# Patient Record
Sex: Male | Born: 1960 | Race: White | Hispanic: No | Marital: Married | Smoking: Never smoker
Health system: Southern US, Community
[De-identification: ages and names within clinical notes are randomized; demographics above are authoritative.]

## PROBLEM LIST (undated history)

## (undated) DIAGNOSIS — I10 Essential (primary) hypertension: Secondary | ICD-10-CM

## (undated) DIAGNOSIS — I2510 Atherosclerotic heart disease of native coronary artery without angina pectoris: Secondary | ICD-10-CM

## (undated) DIAGNOSIS — F419 Anxiety disorder, unspecified: Secondary | ICD-10-CM

## (undated) DIAGNOSIS — E7800 Pure hypercholesterolemia, unspecified: Secondary | ICD-10-CM

## (undated) HISTORY — PX: CATARACT EXTRACTION: SUR2

## (undated) HISTORY — DX: Pure hypercholesterolemia, unspecified: E78.00

## (undated) HISTORY — DX: Atherosclerotic heart disease of native coronary artery without angina pectoris: I25.10

## (undated) HISTORY — DX: Essential (primary) hypertension: I10

---

## 2009-05-21 ENCOUNTER — Encounter: Admission: RE | Admit: 2009-05-21 | Discharge: 2009-05-21 | Payer: Self-pay | Admitting: Internal Medicine

## 2011-09-22 ENCOUNTER — Other Ambulatory Visit: Payer: Self-pay

## 2011-09-22 ENCOUNTER — Emergency Department (HOSPITAL_COMMUNITY): Payer: 59

## 2011-09-22 ENCOUNTER — Emergency Department (HOSPITAL_COMMUNITY)
Admission: EM | Admit: 2011-09-22 | Discharge: 2011-09-22 | Disposition: A | Discharge status: Home / Self Care | Payer: 59 | Attending: Emergency Medicine | Admitting: Emergency Medicine

## 2011-09-22 DIAGNOSIS — R55 Syncope and collapse: Secondary | ICD-10-CM

## 2011-09-22 DIAGNOSIS — R404 Transient alteration of awareness: Secondary | ICD-10-CM | POA: Insufficient documentation

## 2011-09-22 DIAGNOSIS — F411 Generalized anxiety disorder: Secondary | ICD-10-CM | POA: Insufficient documentation

## 2011-09-22 DIAGNOSIS — R232 Flushing: Secondary | ICD-10-CM | POA: Insufficient documentation

## 2011-09-22 DIAGNOSIS — R450 Nervousness: Secondary | ICD-10-CM | POA: Insufficient documentation

## 2011-09-22 DIAGNOSIS — R61 Generalized hyperhidrosis: Secondary | ICD-10-CM | POA: Insufficient documentation

## 2011-09-22 DIAGNOSIS — Z79899 Other long term (current) drug therapy: Secondary | ICD-10-CM | POA: Insufficient documentation

## 2011-09-22 DIAGNOSIS — R110 Nausea: Secondary | ICD-10-CM | POA: Insufficient documentation

## 2011-09-22 DIAGNOSIS — I10 Essential (primary) hypertension: Secondary | ICD-10-CM | POA: Insufficient documentation

## 2011-09-22 HISTORY — DX: Anxiety disorder, unspecified: F41.9

## 2011-09-22 HISTORY — DX: Essential (primary) hypertension: I10

## 2011-09-22 LAB — CBC
HCT: 39.2 % (ref 39.0–52.0)
Hemoglobin: 14.1 g/dL (ref 13.0–17.0)
MCH: 32.1 pg (ref 26.0–34.0)
MCHC: 36 g/dL (ref 30.0–36.0)
MCV: 89.3 fL (ref 78.0–100.0)
Platelets: 209 10*3/uL (ref 150–400)
RBC: 4.39 MIL/uL (ref 4.22–5.81)
RDW: 11.6 % (ref 11.5–15.5)
WBC: 4.2 10*3/uL (ref 4.0–10.5)

## 2011-09-22 LAB — COMPREHENSIVE METABOLIC PANEL
ALT: 30 U/L (ref 0–53)
AST: 32 U/L (ref 0–37)
Albumin: 3.6 g/dL (ref 3.5–5.2)
Alkaline Phosphatase: 49 U/L (ref 39–117)
BUN: 17 mg/dL (ref 6–23)
CO2: 24 mEq/L (ref 19–32)
Calcium: 8.9 mg/dL (ref 8.4–10.5)
Chloride: 100 mEq/L (ref 96–112)
Creatinine, Ser: 0.87 mg/dL (ref 0.50–1.35)
GFR calc Af Amer: >90 mL/min (ref >90)
GFR calc non Af Amer: >90 mL/min (ref >90)
Glucose, Bld: 117 mg/dL — ABNORMAL HIGH (ref 70–99)
Potassium: 3.7 mEq/L (ref 3.5–5.1)
Sodium: 138 mEq/L (ref 135–145)
Total Bilirubin: 0.3 mg/dL (ref 0.3–1.2)
Total Protein: 6.8 g/dL (ref 6.0–8.3)

## 2011-09-22 LAB — MAGNESIUM: Magnesium: 1.9 mg/dL (ref 1.5–2.5)

## 2011-09-22 LAB — CARDIAC PANEL(CRET KIN+CKTOT+MB+TROPI)
CK, MB: 2.3 ng/mL (ref 0.3–4.0)
CK, MB: 2.3 ng/mL (ref 0.3–4.0)
Relative Index: 1.1 (ref 0.0–2.5)
Relative Index: 1.2 (ref 0.0–2.5)
Total CK: 212 U/L (ref 7–232)
Total CK: 196 U/L (ref 7–232)
Troponin I: <0.3 ng/mL (ref <0.30)
Troponin I: <0.3 ng/mL (ref <0.30)

## 2011-09-22 LAB — GLUCOSE, CAPILLARY
Glucose-Capillary: 107 mg/dL — ABNORMAL HIGH (ref 70–99)
Glucose-Capillary: 98 mg/dL (ref 70–99)

## 2011-09-22 LAB — POCT I-STAT TROPONIN I: Troponin i, poc: 0 ng/mL (ref 0.00–0.08)

## 2011-09-22 MED ORDER — LORAZEPAM 1 MG PO TABS: 0.5000 mg | ORAL_TABLET | Freq: Three times a day (TID) | ORAL | Status: AC | PRN

## 2011-09-22 MED ORDER — LORAZEPAM 2 MG/ML IJ SOLN
1.0000 mg | Freq: Once | INTRAMUSCULAR | Status: AC
  Administered 2011-09-22: 1 mg via INTRAVENOUS
  Filled 2011-09-22: qty 1

## 2011-09-22 NOTE — ED Notes (Signed)
When wasting administered Ativan for pt, it was inadvertently put into pyxis that the second admin was completely wasted, when in fact it had only 1mg  of waste.  It was then decided to leave the first admin of 1mg  unwasted as it would illustrate the total dosage of 2mg  between the two separate administrations.  This was done by primary nurse Ricki Miller and secondary nurse Madgie Dhaliwal.

## 2011-09-22 NOTE — ED Notes (Signed)
Pt had a syncope episode x 1 min while in our cancer center, was there with his dad who was receiving chemo. Pt now alert, oriented, airway intact '

## 2011-09-22 NOTE — ED Notes (Signed)
Called to room by tech because "pt is not acting right" since his return from MRI.  Entered room to find pt Alert and oriented x3, able to follow commands, PERRLA, able to puff cheeks, show teeth, hold tounge midline, shrug shoulders and rotate neck to left and right against resistance all with no trouble.  Strength 2+ and equal bilaterally.  VSS, but tachypenic. Pt states he is very anxious.  CBG 98.  EDMD notified of episode.

## 2011-09-22 NOTE — ED Notes (Signed)
QIO:NG29<BM> Expected date:09/22/11<BR> Expected time: 9:28 AM<BR> Means of arrival:Other<BR> Comments:<BR> Syncopal pt

## 2011-09-22 NOTE — ED Notes (Signed)
Secondary Assessment: pt had a syncope episode at 0920 this AM at cancer center, felt warm intially then passed out. Last 1 min, hit right side of head. When wake up, pt was clammy, and diaphoretic. Pt is not alert, oriented, sts he feels warm. No pain. Denied previous hx of same.

## 2011-09-22 NOTE — ED Provider Notes (Signed)
History     CSN: [id number] Arrival date & time: 09/22/2011  9:43 AM   First MD Initiated Contact with Patient 09/22/11 1008      Chief Complaint  Patient presents with  . Loss of Consciousness    syncope    (Consider location/radiation/quality/duration/timing/severity/associated sxs/prior treatment) HPI This patient presents following a syncopal episode. He notes no prior episodes. She recalls that just prior to nearly losing consciousness, he had a warm, flushed sensation with mild nausea. There was no pain anywhere throughout the entire episode. Upon regaining full faculties, the patient denied any shortness of breath, confusion, disorientation, and noted only a persistent warm sensation. No recent illness, no recent medical changes, no sick contacts. Past Medical History  Diagnosis Date  . Hypertension   . Anxiety     No past surgical history on file.  No family history on file.  History  Substance Use Topics  . Smoking status: Never Smoker   . Smokeless tobacco: Not on file  . Alcohol Use: Yes      Review of Systems  Constitutional: Positive for diaphoresis.  HENT: Negative.  Negative for sore throat.   Eyes: Negative.  Negative for visual disturbance.  Respiratory: Negative.  Negative for shortness of breath.   Cardiovascular: Negative for chest pain.  Gastrointestinal: Positive for nausea.  Genitourinary: Negative for dysuria.  Musculoskeletal: Negative for myalgias.  Skin: Negative for rash.  Neurological: Negative for headaches.  Psychiatric/Behavioral: The patient is nervous/anxious.     Allergies  Review of patient's allergies indicates no known allergies.  Home Medications   Current Outpatient Rx  Name Route Sig Dispense Refill  . CHOLECALCIFEROL 400 UNITS PO TABS Oral Take 400 Units by mouth daily.      Marland Kitchen CLONAZEPAM 0.5 MG PO TABS Oral Take 0.5 mg by mouth daily as needed. Anxiety      . OLMESARTAN MEDOXOMIL-HCTZ 40-25 MG PO TABS Oral Take 1  tablet by mouth daily.      Marland Kitchen OVER THE COUNTER MEDICATION Oral Take 1 tablet by mouth daily. Chromium picolinate       BP 126/76  Pulse 62  Temp(Src) 98.3 F (36.8 C) (Oral)  Resp 18  SpO2 95%  Physical Exam  Constitutional: He is oriented to person, place, and time. He appears well-developed and well-nourished.  HENT:  Head: Normocephalic and atraumatic.  Eyes: Conjunctivae are normal. Pupils are equal, round, and reactive to light.  Neck: Neck supple.  Cardiovascular: Normal rate and regular rhythm.   Pulmonary/Chest: No respiratory distress.  Abdominal: Soft. There is no tenderness.  Musculoskeletal: He exhibits no edema.  Neurological: He is alert and oriented to person, place, and time.  Skin: Skin is warm and dry.  Psychiatric: He has a normal mood and affect.    ED Course  Procedures (including critical care time)  Labs Reviewed  GLUCOSE, CAPILLARY - Abnormal; Notable for the following:    Glucose-Capillary 107 (*)    All other components within normal limits  POCT CBG MONITORING  CARDIAC PANEL(CRET KIN+CKTOT+MB+TROPI)  COMPREHENSIVE METABOLIC PANEL  MAGNESIUM  CBC   No results found.   No diagnosis found.   Date: 09/22/2011  Rate: 61  Rhythm: normal sinus rhythm  QRS Axis: normal  Intervals: normal  ST/T Wave abnormalities: normal  Conduction Disutrbances:none  Narrative Interpretation:   Old EKG Reviewed: none available    MDM  This generally well-appearing 50 year old male presents following a syncopal episode. The patient's description of a prodromal episode with no  chest pain or confusion or headache throughout, as well as the absence of entire loss of consciousness is reassuring and suggestive of vagal mediated syncope. On exam the patient is in no distress though he appears anxious. The patient's initial labs were reassuring however the CT demonstrated possible thalamic lesions. A followup MR did not demonstrate acute pathology. Serial cardiac  enzymes were not positive. Given the absence of cardiac enzyme elevation, any dysrhythmia on ECG, any acute pathology on neurologic studies, the patient will be discharged to follow up with his primary care physician.        Gerhard Munch, MD 09/22/11 [phone]

## 2015-05-02 ENCOUNTER — Ambulatory Visit: Payer: Self-pay | Admitting: Podiatry

## 2015-05-09 ENCOUNTER — Encounter: Payer: Self-pay | Admitting: Podiatry

## 2015-05-09 ENCOUNTER — Ambulatory Visit (INDEPENDENT_AMBULATORY_CARE_PROVIDER_SITE_OTHER): Payer: 59 | Admitting: Podiatry

## 2015-05-09 VITALS — BP 143/96 | HR 73 | Resp 12 | Ht 70.0 in | Wt 198.0 lb

## 2015-05-09 DIAGNOSIS — B351 Tinea unguium: Secondary | ICD-10-CM

## 2015-05-09 NOTE — Progress Notes (Signed)
   Subjective:    Patient ID: Chad Reynolds, male    DOB: May 01, 1961, 54 y.o.   MRN: [id number]  HPI    Review of Systems  All other systems reviewed and are negative.      Objective:   Physical Exam        Assessment & Plan:

## 2015-05-10 NOTE — Progress Notes (Signed)
Subjective:     Patient ID: Chad Reynolds, male   DOB: 1960/12/04, 54 y.o.   MRN: [id number]  HPI patient presents stating I have had a crumbled appearance to my right second nail over the last month with darkness and I'm not sure what I did and also several other nails that have a small amount of yellow discoloration   Review of Systems  All other systems reviewed and are negative.      Objective:   Physical Exam  Constitutional: He is oriented to person, place, and time.  Cardiovascular: Intact distal pulses.   Musculoskeletal: Normal range of motion.  Neurological: He is oriented to person, place, and time.  Skin: Skin is warm.  Nursing note and vitals reviewed.  neurovascular status intact muscle strength adequate range of motion within normal limits patient noted to have discoloration and what appears to be a dark type appearance to the right second nail bed of recent duration and several of the nails and a small amount of yellow discoloration distal on the hallux bilateral. Patient is also noted to have good digital perfusion is well oriented 3     Assessment:     Probable trauma to the right second nail with other nails having fungal infection    Plan:     H&P and condition reviewed and I advised this should grow out or eventually fall off and if it were to continue to persist over the next few months or 2 darkened or become painful and remove the nail and send to pathology. The other nails will be treated with a topical anti-fungal which was dispensed and patient will be seen back

## 2015-06-27 ENCOUNTER — Ambulatory Visit: Payer: 59 | Admitting: Podiatry

## 2016-02-07 ENCOUNTER — Encounter: Payer: Self-pay | Admitting: Podiatry

## 2016-02-07 ENCOUNTER — Ambulatory Visit (INDEPENDENT_AMBULATORY_CARE_PROVIDER_SITE_OTHER): Payer: 59 | Admitting: Podiatry

## 2016-02-07 DIAGNOSIS — M722 Plantar fascial fibromatosis: Secondary | ICD-10-CM

## 2016-02-07 DIAGNOSIS — B351 Tinea unguium: Secondary | ICD-10-CM

## 2016-02-08 NOTE — Progress Notes (Signed)
Subjective:     Patient ID: Chad Reynolds, male   DOB: 1961-08-18, 56 y.o.   MRN: [id number]  HPI patient presents concerned about his right second nail and states he's had some thickness of his nailbeds   Review of Systems     Objective:   Physical Exam Neurovascular status intact no change in health history with thickness of the nailbed that's localized in nature    Assessment:     Probable trauma to the nailbed itself    Plan:     Reviewed condition and trauma and do not recommend oral or antifungal therapy with consideration long-term for nail removal but I educated him on today

## 2016-02-29 ENCOUNTER — Ambulatory Visit (INDEPENDENT_AMBULATORY_CARE_PROVIDER_SITE_OTHER): Payer: 59 | Admitting: Podiatry

## 2016-02-29 ENCOUNTER — Encounter: Payer: Self-pay | Admitting: Podiatry

## 2016-02-29 VITALS — BP 127/81 | HR 69 | Resp 16

## 2016-02-29 DIAGNOSIS — L600 Ingrowing nail: Secondary | ICD-10-CM | POA: Diagnosis not present

## 2016-02-29 NOTE — Patient Instructions (Addendum)

## 2016-03-02 NOTE — Progress Notes (Signed)
Subjective:     Patient ID: Chad Reynolds, male   DOB: 29-May-1961, 55 y.o.   MRN: [id number]  HPI patient presents stating this right second nail is damaged and I know on getting need to have it removed. I want to get it done now   Review of Systems     Objective:   Physical Exam Neurovascular status intact with chronic deformity of the right second nail that's damaged and incurvated with some thickness on the dorsal surface that can become painful    Assessment:     Damaged second nail right    Plan:     Reviewed condition and recommended removal. I explained risk of procedure and explained will be done permanently and patient wants surgery and today I infiltrated the right second toe 60 mg like Marcaine mixture remove the nail exposed matrix and applied phenol 3 applications 30 seconds followed by alcohol lavage and sterile dressing. Gave instructions on soaks and reappoint

## 2016-03-07 ENCOUNTER — Telehealth: Payer: Self-pay | Admitting: *Deleted

## 2016-03-07 NOTE — Telephone Encounter (Signed)
Called patient at [phone] (Home #) to check to see how they were doing from when their nail was removed on Friday, February 29, 2016. I spoke to patient's wife who stated, "He's doing okay and soaking toe with some relief". I told patient's wife if her husband has any questions or concerns to call our office back. She stated she understood.

## 2016-04-28 ENCOUNTER — Telehealth: Payer: Self-pay | Admitting: *Deleted

## 2016-04-28 NOTE — Telephone Encounter (Signed)
Pt's wife, [name] states pt had ingrown procedure by Dr. Paulla Dolly and a chemical was applied to stop the nail from growing back but it has. I informed [name] that on rare occasion the nail can grow back, if it is causing problems he would need to have the procedure again.  Kim asked if they would have to pay again since it was out of pocket and I told her that would need to be discussed with Dr. Paulla Dolly.

## 2017-04-09 DIAGNOSIS — B354 Tinea corporis: Secondary | ICD-10-CM | POA: Diagnosis not present

## 2017-07-28 ENCOUNTER — Other Ambulatory Visit: Payer: Self-pay

## 2017-07-28 ENCOUNTER — Encounter: Payer: Self-pay | Admitting: Physician Assistant

## 2017-07-28 ENCOUNTER — Ambulatory Visit (INDEPENDENT_AMBULATORY_CARE_PROVIDER_SITE_OTHER): Payer: 59

## 2017-07-28 ENCOUNTER — Ambulatory Visit (INDEPENDENT_AMBULATORY_CARE_PROVIDER_SITE_OTHER): Payer: 59 | Admitting: Physician Assistant

## 2017-07-28 VITALS — BP 122/85 | HR 65 | Resp 16 | Ht 70.0 in | Wt 206.8 lb

## 2017-07-28 DIAGNOSIS — S62639A Displaced fracture of distal phalanx of unspecified finger, initial encounter for closed fracture: Secondary | ICD-10-CM

## 2017-07-28 DIAGNOSIS — S6992XA Unspecified injury of left wrist, hand and finger(s), initial encounter: Secondary | ICD-10-CM | POA: Diagnosis not present

## 2017-07-28 DIAGNOSIS — S62631A Displaced fracture of distal phalanx of left index finger, initial encounter for closed fracture: Secondary | ICD-10-CM | POA: Diagnosis not present

## 2017-07-28 DIAGNOSIS — S67193A Crushing injury of left middle finger, initial encounter: Secondary | ICD-10-CM | POA: Diagnosis not present

## 2017-07-28 DIAGNOSIS — S62635A Displaced fracture of distal phalanx of left ring finger, initial encounter for closed fracture: Secondary | ICD-10-CM | POA: Diagnosis not present

## 2017-07-28 DIAGNOSIS — S67191A Crushing injury of left index finger, initial encounter: Secondary | ICD-10-CM | POA: Diagnosis not present

## 2017-07-28 MED ORDER — HYDROCODONE-ACETAMINOPHEN 5-325 MG PO TABS: 1.0000 | ORAL_TABLET | Freq: Four times a day (QID) | ORAL | 0 refills | Status: AC | PRN

## 2017-07-28 NOTE — Progress Notes (Signed)
PRIMARY CARE AT [place], [place] [address] [phone]  Date:  07/28/2017   Name:  Chad Reynolds   DOB:  1961/09/10   MRN:  [id number]  PCP:  Tommy Medal, MD    History of Present Illness:  Chad Reynolds is a 56 y.o. male patient who presents to PCP with  Chief Complaint  Patient presents with  . Hand Injury    per patient, got 3 fingers on his left hand caught in garage door, patient states that he heard an audible "crack"     Patient reports that he was pulling his garage door, when it got caught between 2 sections of the door.  He thought he heard a crack.  This occurred about 3 hours ago.  He has had pain of his pointer, middle, and ring finger.  He has noticed more pain of the ring finger along with bruising of the finger.  Non-dominant finger.  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   There are no active problems to display for this patient.   Past Medical History:  Diagnosis Date  . Anxiety   . Hypertension     No past surgical history on file.  Social History  Substance Use Topics  . Smoking status: Never Smoker  . Smokeless tobacco: Never Used  . Alcohol use Yes    No family history on file.  No Known Allergies  Medication list has been reviewed and updated.  Current Outpatient Prescriptions on File Prior to Visit  Medication Sig Dispense Refill  . cholecalciferol (VITAMIN D) 400 UNITS TABS Take 400 Units by mouth daily.      Marland Kitchen olmesartan-hydrochlorothiazide (BENICAR HCT) 40-25 MG per tablet Take 1 tablet by mouth daily.      Marland Kitchen OVER THE COUNTER MEDICATION Take 1 tablet by mouth daily. Chromium picolinate     . PRESCRIPTION MEDICATION     . Vilazodone HCl (VIIBRYD PO) Take by mouth.    . clonazePAM (KLONOPIN) 0.5 MG tablet Take 0.5 mg by mouth daily as needed. Anxiety       No current facility-administered medications on file prior to visit.     ROS ROS otherwise unremarkable unless listed above.  Physical Examination: BP 122/85 (BP Location:  Right Arm, Patient Position: Sitting, Cuff Size: Normal)   Pulse 65   Resp 16   Ht 5\' 10"  (1.778 m)   Wt 206 lb 12.8 oz (93.8 kg)   SpO2 95%   BMI 29.67 kg/m  Ideal Body Weight: Weight in (lb) to have BMI = 25: 173.9  Physical Exam  Constitutional: He is oriented to person, place, and time. He appears well-developed and well-nourished. No distress.  HENT:  Head: Normocephalic and atraumatic.  Eyes: Pupils are equal, round, and reactive to light. Conjunctivae and EOM are normal.  Cardiovascular: Normal rate.   Pulmonary/Chest: Effort normal. No respiratory distress.  Musculoskeletal:       Left hand: Normal sensation noted. Normal strength noted.  4th left finger hematoma about 40% nail.  Tender upon palpation.  Good ROM of dip through 2nd-4th fingers.  Neurological: He is alert and oriented to person, place, and time.  Skin: Skin is warm and dry. He is not diaphoretic.  Psychiatric: He has a normal mood and affect. His behavior is normal.    Dg Hand Complete Left  Result Date: 07/28/2017 CLINICAL DATA:  Injured the left hand with pain EXAM: LEFT HAND - COMPLETE 3+ VIEW COMPARISON:  None. FINDINGS: There is  transverse minimally displaced fracture of the tuft of the distal phalanx of the left fourth digit. No other acute abnormality is seen. Joint spaces appear normal. No erosion is seen. IMPRESSION: Minimally displaced fracture of the tuft of the distal phalanx of the left fourth digit. Electronically Signed   By: Ivar Drape M.D.   On: 07/28/2017 08:58     Assessment and Plan: Chad Reynolds is a 56 y.o. male who is here today for cc of  Chief Complaint  Patient presents with  . Hand Injury    per patient, got 3 fingers on his left hand caught in garage door, patient states that he heard an audible "crack"   Discussed the displaced finger fracture with option to see hand vs watching, placed in bracing and follow up in 3-4 weeks for recheck of symptoms. He would like to go ahead  and be braced and rtc for reimaging.  He was advised to come out of the brace for rom during the day.  He voiced understanding.   Warned of alarming sxs to warrant immediate return.  Closed fracture of tuft of distal phalanx of finger  Hand injury, left, initial encounter - Plan: DG Hand Complete Left, CANCELED: DG Hand Complete Left  Ivar Drape, PA-C [facility] 9/23/20184:23 PM

## 2017-07-28 NOTE — Patient Instructions (Addendum)
IF you received an x-ray today, you will receive an invoice from [facility]. Please contact [facility] at [phone] with questions or concerns regarding your invoice.   IF you received labwork today, you will receive an invoice from [facility]. Please contact [facility] at [phone] with questions or concerns regarding your invoice.   Our billing staff will not be able to assist you with questions regarding bills from these companies.  You will be contacted with the lab results as soon as they are available. The fastest way to get your results is to activate your My Chart account. Instructions are located on the last page of this paperwork. If you have not heard from [place] regarding the results in 2 weeks, please contact this office.    Please come out of the finger splint often and bend the fingers back and forth to keep mobility of the finger.    Finger Fracture A finger fracture is a break in any of the bones in your fingers. Usually, a broken finger is caused by an injury. This includes:  Getting hurt while playing sports.  Getting hurt at work.  Falling.  Your doctor may put a splint on your finger so it will not move while it heals (immobilization). Follow these instructions at home: If you have a splint  Wear the splint as told by your doctor. Take it off only as told by your doctor.  Loosen the splint if your fingers tingle, get numb, or turn cold and blue.  Keep the splint clean.  If the splint is not waterproof: ? Do not let it get wet. ? Cover it with a watertight covering when you take a bath or a shower. Managing pain, stiffness, and swelling  If directed, put ice on the injured area: ? If you have a removable splint, take it off as told by your doctor. ? Put ice in a plastic bag. ? Place a towel between your skin and the bag. ? Leave the ice on for 20 minutes, 2-3 times a day.  Move your fingers often to help with stiffness and  swelling.  Raise (elevate) the injured area above the level of your heart while you are sitting or lying down. Driving  Do not drive or use heavy machinery while taking prescription pain medicine.  Ask your doctor when it is safe to drive if you have a splint. General instructions  Do not put pressure on any part of the splint until it is fully hardened. This may take several hours.  Do not use any products that contain nicotine or tobacco, such as cigarettes and e-cigarettes. These can delay bone healing. If you need help quitting, ask your doctor.  Take over-the-counter and prescription medicines only as told by your doctor.  Do exercises as told by your doctor.  Keep all follow-up visits as told by your doctor. This is important. Contact a doctor if:  Your pain or swelling gets worse.  You have trouble moving your finger. Get help right away if:  Your finger gets numb or turns blue. Summary  A finger fracture is a break in any of the bones in your fingers.  Usually, a broken finger is caused by an injury. This may be from getting hurt in sports or at work or from falling.  You may need to wear a splint on your finger so it will not move while it heals (immobilization). This information is not intended to replace advice given to you by  your health care provider. Make sure you discuss any questions you have with your health care provider. Document Released: 04/14/2008 Document Revised: 09/16/2016 Document Reviewed: 09/16/2016 Elsevier Interactive Patient Education  2017 [employer].

## 2017-08-02 ENCOUNTER — Encounter: Payer: Self-pay | Admitting: Physician Assistant

## 2017-08-11 DIAGNOSIS — Z23 Encounter for immunization: Secondary | ICD-10-CM | POA: Diagnosis not present

## 2017-08-27 DIAGNOSIS — S67191D Crushing injury of left index finger, subsequent encounter: Secondary | ICD-10-CM | POA: Diagnosis not present

## 2017-08-27 DIAGNOSIS — S67193D Crushing injury of left middle finger, subsequent encounter: Secondary | ICD-10-CM | POA: Diagnosis not present

## 2017-08-27 DIAGNOSIS — S62635D Displaced fracture of distal phalanx of left ring finger, subsequent encounter for fracture with routine healing: Secondary | ICD-10-CM | POA: Diagnosis not present

## 2017-10-05 DIAGNOSIS — Z125 Encounter for screening for malignant neoplasm of prostate: Secondary | ICD-10-CM | POA: Diagnosis not present

## 2017-10-05 DIAGNOSIS — Z1159 Encounter for screening for other viral diseases: Secondary | ICD-10-CM | POA: Diagnosis not present

## 2017-10-05 DIAGNOSIS — Z0000 Encounter for general adult medical examination without abnormal findings: Secondary | ICD-10-CM | POA: Diagnosis not present

## 2017-10-08 DIAGNOSIS — Z0001 Encounter for general adult medical examination with abnormal findings: Secondary | ICD-10-CM | POA: Diagnosis not present

## 2017-10-08 DIAGNOSIS — L309 Dermatitis, unspecified: Secondary | ICD-10-CM | POA: Diagnosis not present

## 2017-10-08 DIAGNOSIS — E7800 Pure hypercholesterolemia, unspecified: Secondary | ICD-10-CM | POA: Diagnosis not present

## 2017-10-27 DIAGNOSIS — D1801 Hemangioma of skin and subcutaneous tissue: Secondary | ICD-10-CM | POA: Diagnosis not present

## 2017-10-27 DIAGNOSIS — L821 Other seborrheic keratosis: Secondary | ICD-10-CM | POA: Diagnosis not present

## 2017-10-27 DIAGNOSIS — L300 Nummular dermatitis: Secondary | ICD-10-CM | POA: Diagnosis not present

## 2017-12-14 IMAGING — DX DG HAND COMPLETE 3+V*L*
3 series · 3 of 3 positions shown · non-contrast
Comparison: None.

CLINICAL DATA: Injured the left hand with pain

EXAM:
LEFT HAND - COMPLETE 3+ VIEW

[hand pa]
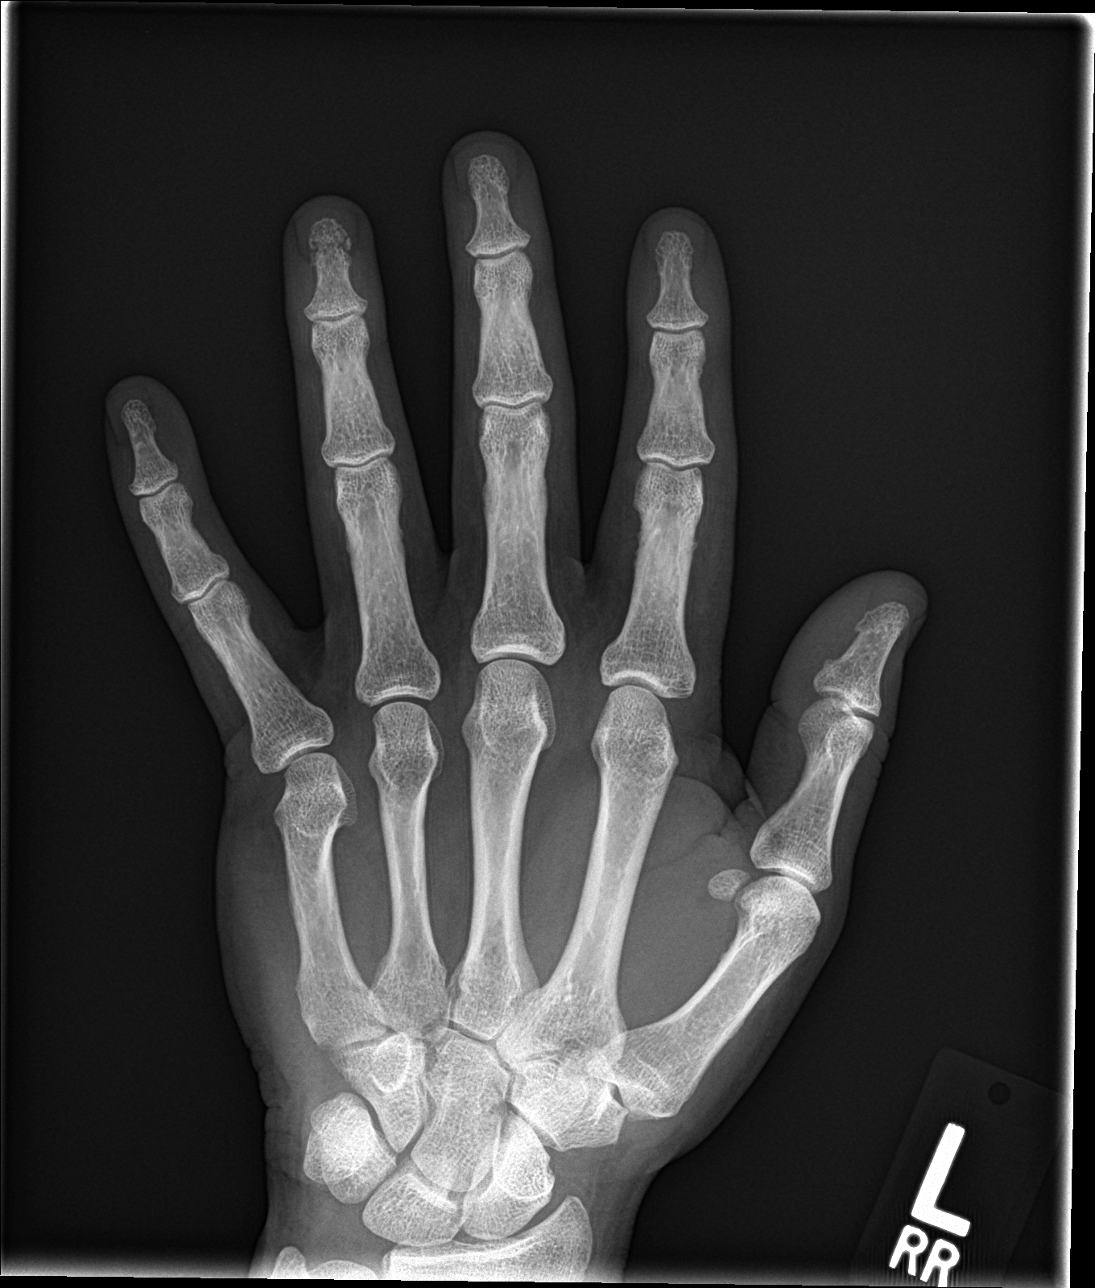

[hand obl]
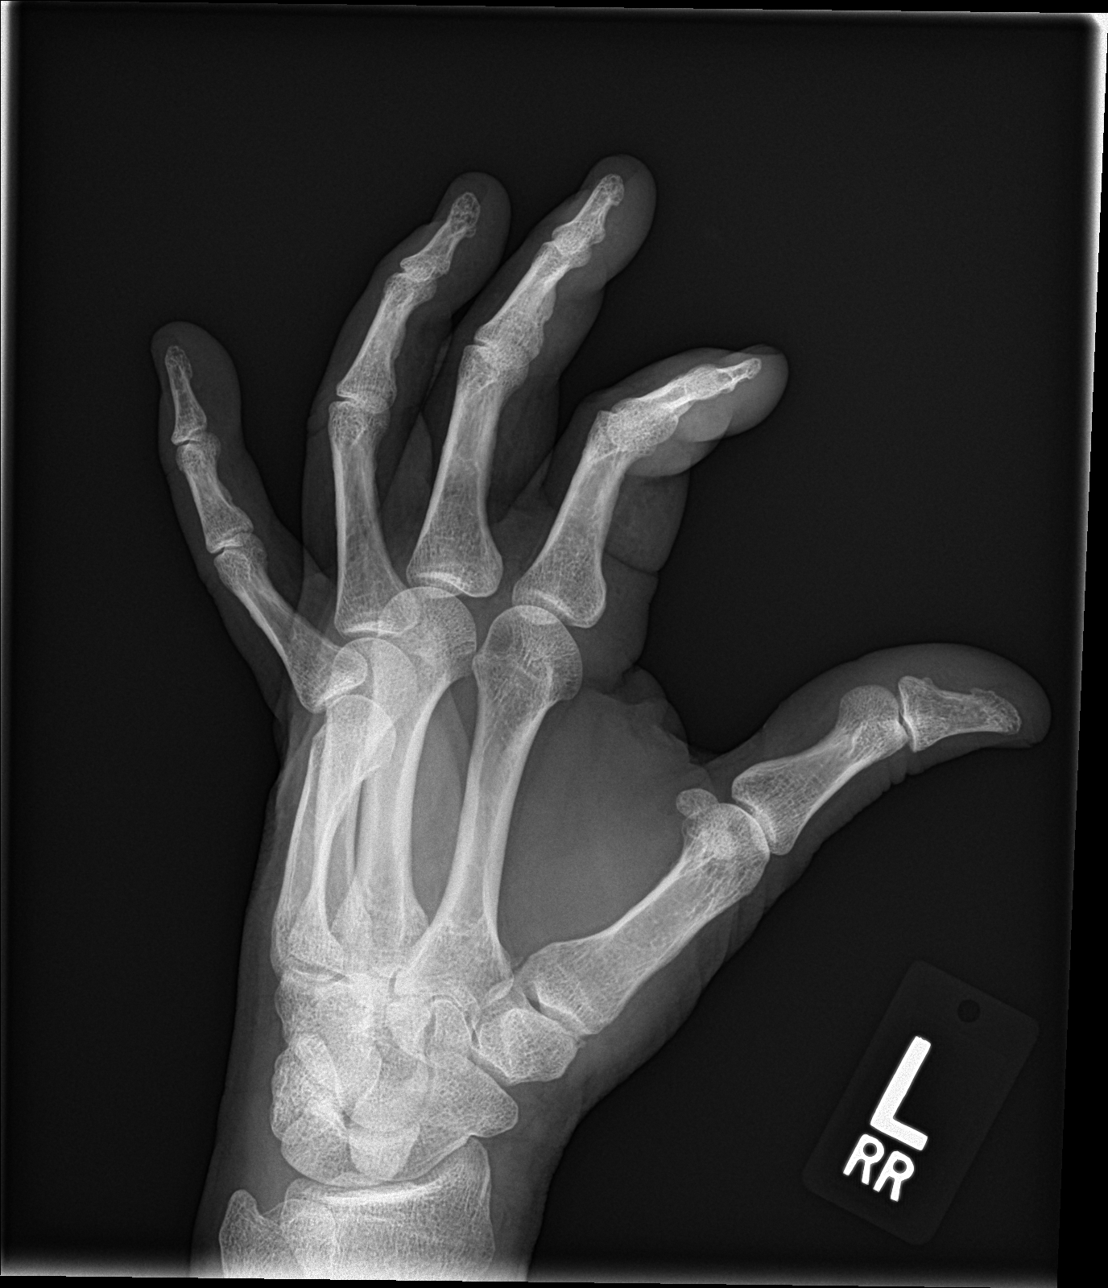

[hand lat]
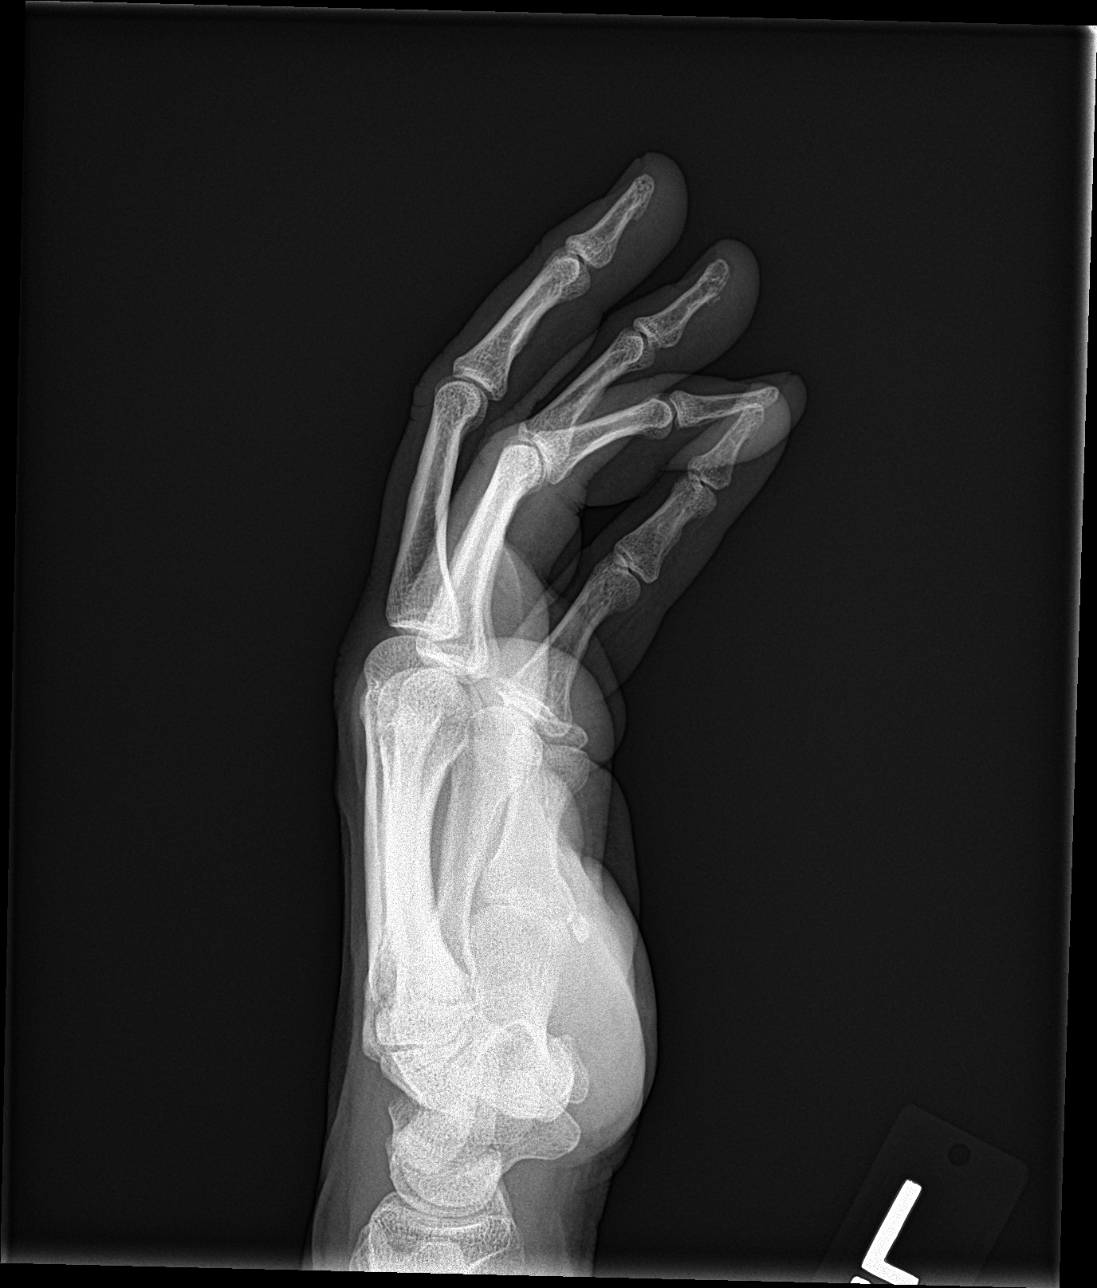

[3 of 3 positions shown; findings below may reference images not displayed]

FINDINGS: There is transverse minimally displaced fracture of the tuft of the
distal phalanx of the left fourth digit. No other acute abnormality
is seen. Joint spaces appear normal. No erosion is seen.
IMPRESSION: Minimally displaced fracture of the tuft of the distal phalanx of
the left fourth digit.

## 2017-12-17 ENCOUNTER — Ambulatory Visit: Payer: 59 | Admitting: Neurology

## 2017-12-17 ENCOUNTER — Encounter: Payer: Self-pay | Admitting: Neurology

## 2017-12-17 VITALS — BP 121/79 | HR 69 | Ht 70.0 in | Wt 215.0 lb

## 2017-12-17 DIAGNOSIS — G251 Drug-induced tremor: Secondary | ICD-10-CM

## 2017-12-17 NOTE — Progress Notes (Signed)
Reason for visit: Tremor  Referring physician: Dr. Awilda Bill Chad Reynolds is a 57 y.o. male  History of present illness:  Chad Reynolds is a 57 year old right-handed white male with a history of bipolar disorder.  The patient has been treated with Depakote, the medication was started about 2 years ago.  About 2 years ago, the patient began noting a tremor involving both upper extremities.  He has noted that he may have some difficulty with typing, there has been some minimal impact on his ability to perform handwriting, he may sometimes have difficulty with drinking liquids or feeding himself.  The patient denies any tremor affecting the voice or the head or neck.  The patient denies any balance issues, but no falls.  He has not had any numbness or weakness of the extremities or difficulty with headache or dizziness.  He denies issues controlling the bowels or the bladder.  He does have a prominent family history of tremor, his maternal grandmother, and his mother had an essential tremor.  His mother had a deep brain stimulator placement as the tremor was quite severe.  The patient is concerned that he may be developing a similar tremor.  The patient has 3 brothers, none of his siblings have tremor.  His mother developed a tremor in her mid 35s.  Past Medical History:  Diagnosis Date  . Anxiety   . Hypertension     Past Surgical History:  Procedure Laterality Date  . CATARACT EXTRACTION Bilateral     Family History  Problem Relation Age of Onset  . Tremor Mother   . Diabetes Mother   . Diabetes Father   . Atrial fibrillation Father   . Heart disease Father   . Lymphoma Father   . Tremor Maternal Grandmother     Social history:  reports that  has never smoked. he has never used smokeless tobacco. He reports that he drinks alcohol. He reports that he does not use drugs.  Medications:  Prior to Admission medications   Medication Sig Start Date End Date Taking? Authorizing Provider    Azilsartan Medoxomil (EDARBI) 40 MG TABS Take 40 mg by mouth daily.   Yes [provider]  divalproex (DEPAKOTE) 500 MG DR tablet Take 500 mg by mouth.   Yes [provider]  DULoxetine (CYMBALTA) 60 MG capsule Take 30 mg by mouth daily.    Yes [provider]  naltrexone (DEPADE) 50 MG tablet Take 50 mg by mouth daily.   Yes [provider]  OVER THE COUNTER MEDICATION Take 1 tablet by mouth daily. Chromium picolinate    Yes [provider]     No Known Allergies  ROS:  Out of a complete 14 system review of symptoms, the patient complains only of the following symptoms, and all other reviewed systems are negative.  Joint pain Skin sensitivity Weakness, tremor Anxiety, decreased energy Snoring  Blood pressure 121/79, pulse 69, height 5\' 10"  (1.778 m), weight 215 lb (97.5 kg).  Physical Exam  General: The patient is alert and cooperative at the time of the examination.  Eyes: Pupils are equal, round, and reactive to light. Discs are flat bilaterally.  Neck: The neck is supple, no carotid bruits are noted.  Respiratory: The respiratory examination is clear.  Cardiovascular: The cardiovascular examination reveals a regular rate and rhythm, no obvious murmurs or rubs are noted.  Skin: Extremities are without significant edema.  Neurologic Exam  Mental status: The patient is alert  and oriented x 3 at the time of the examination. The patient has apparent normal recent and remote memory, with an apparently normal attention span and concentration ability.  Cranial nerves: Facial symmetry is present. There is good sensation of the face to pinprick and soft touch bilaterally. The strength of the facial muscles and the muscles to head turning and shoulder shrug are normal bilaterally. Speech is well enunciated, no aphasia or dysarthria is noted. Extraocular movements are full. Visual fields are full. The tongue is midline, and the patient has  symmetric elevation of the soft palate. No obvious hearing deficits are noted.  Motor: The motor testing reveals 5 over 5 strength of all 4 extremities. Good symmetric motor tone is noted throughout.  Sensory: Sensory testing is intact to pinprick, soft touch, vibration sensation, and position sense on all 4 extremities. No evidence of extinction is noted.  Coordination: Cerebellar testing reveals good finger-nose-finger and heel-to-shin bilaterally.  The patient does have a fine action type tremor with both upper extremities.  Minimal tremor is translated into handwriting when drawing a spiral.  Gait and station: Gait is normal. Tandem gait is normal. Romberg is negative. No drift is seen.  Reflexes: Deep tendon reflexes are symmetric and normal bilaterally. Toes are downgoing bilaterally.   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  Family history of essential tremor  The patient is at risk for developing an essential tremor, it is possible he may develop this later on in life.  Currently, his tremor appears to be a fine action type tremor most consistent with a Depakote tremor.  Cessation of Depakote should alleviate the tremor.  The patient also drinks 4 or 5 caffeinated soft drinks a day, I have recommended that he reduce his caffeine intake.  The patient will follow-up through this office on an as-needed basis.  Jill Alexanders MD 12/17/2017 9:02 AM  [facility] [address] [place], [place] [address]  Phone [phone] Fax [phone]

## 2018-01-07 DIAGNOSIS — E291 Testicular hypofunction: Secondary | ICD-10-CM | POA: Diagnosis not present

## 2018-01-07 DIAGNOSIS — R291 Meningismus: Secondary | ICD-10-CM | POA: Diagnosis not present

## 2018-01-07 DIAGNOSIS — R251 Tremor, unspecified: Secondary | ICD-10-CM | POA: Diagnosis not present

## 2018-01-11 DIAGNOSIS — M7711 Lateral epicondylitis, right elbow: Secondary | ICD-10-CM | POA: Diagnosis not present

## 2018-01-12 DIAGNOSIS — E291 Testicular hypofunction: Secondary | ICD-10-CM | POA: Diagnosis not present

## 2018-01-26 DIAGNOSIS — E291 Testicular hypofunction: Secondary | ICD-10-CM | POA: Diagnosis not present

## 2018-01-28 DIAGNOSIS — R270 Ataxia, unspecified: Secondary | ICD-10-CM | POA: Diagnosis not present

## 2018-01-28 DIAGNOSIS — G252 Other specified forms of tremor: Secondary | ICD-10-CM | POA: Diagnosis not present

## 2018-01-28 DIAGNOSIS — Z820 Family history of epilepsy and other diseases of the nervous system: Secondary | ICD-10-CM | POA: Diagnosis not present

## 2018-02-06 DIAGNOSIS — R062 Wheezing: Secondary | ICD-10-CM | POA: Diagnosis not present

## 2018-02-09 DIAGNOSIS — E291 Testicular hypofunction: Secondary | ICD-10-CM | POA: Diagnosis not present

## 2018-02-09 DIAGNOSIS — R05 Cough: Secondary | ICD-10-CM | POA: Diagnosis not present

## 2018-02-12 DIAGNOSIS — G250 Essential tremor: Secondary | ICD-10-CM | POA: Insufficient documentation

## 2018-02-12 HISTORY — DX: Essential tremor: G25.0

## 2018-02-17 ENCOUNTER — Encounter: Payer: Self-pay | Admitting: Physician Assistant

## 2018-02-23 DIAGNOSIS — E291 Testicular hypofunction: Secondary | ICD-10-CM | POA: Diagnosis not present

## 2018-03-09 DIAGNOSIS — E291 Testicular hypofunction: Secondary | ICD-10-CM | POA: Diagnosis not present

## 2018-03-23 DIAGNOSIS — E291 Testicular hypofunction: Secondary | ICD-10-CM | POA: Diagnosis not present

## 2018-04-02 DIAGNOSIS — B354 Tinea corporis: Secondary | ICD-10-CM | POA: Diagnosis not present

## 2018-04-02 DIAGNOSIS — L280 Lichen simplex chronicus: Secondary | ICD-10-CM | POA: Diagnosis not present

## 2018-04-09 DIAGNOSIS — I10 Essential (primary) hypertension: Secondary | ICD-10-CM | POA: Diagnosis not present

## 2018-04-09 DIAGNOSIS — I82402 Acute embolism and thrombosis of unspecified deep veins of left lower extremity: Secondary | ICD-10-CM | POA: Diagnosis not present

## 2018-04-09 DIAGNOSIS — M7989 Other specified soft tissue disorders: Secondary | ICD-10-CM | POA: Diagnosis not present

## 2018-04-09 DIAGNOSIS — L03116 Cellulitis of left lower limb: Secondary | ICD-10-CM | POA: Diagnosis not present

## 2018-04-09 DIAGNOSIS — Z79899 Other long term (current) drug therapy: Secondary | ICD-10-CM | POA: Diagnosis not present

## 2018-04-10 DIAGNOSIS — I10 Essential (primary) hypertension: Secondary | ICD-10-CM | POA: Diagnosis not present

## 2018-04-10 DIAGNOSIS — Z79899 Other long term (current) drug therapy: Secondary | ICD-10-CM | POA: Diagnosis not present

## 2018-04-10 DIAGNOSIS — L03116 Cellulitis of left lower limb: Secondary | ICD-10-CM | POA: Diagnosis not present

## 2018-04-13 DIAGNOSIS — L0390 Cellulitis, unspecified: Secondary | ICD-10-CM | POA: Diagnosis not present

## 2018-04-14 DIAGNOSIS — Z79899 Other long term (current) drug therapy: Secondary | ICD-10-CM | POA: Diagnosis not present

## 2018-04-14 DIAGNOSIS — Z125 Encounter for screening for malignant neoplasm of prostate: Secondary | ICD-10-CM | POA: Diagnosis not present

## 2018-04-14 DIAGNOSIS — Z5181 Encounter for therapeutic drug level monitoring: Secondary | ICD-10-CM | POA: Diagnosis not present

## 2018-04-20 DIAGNOSIS — L0390 Cellulitis, unspecified: Secondary | ICD-10-CM | POA: Diagnosis not present

## 2018-04-20 DIAGNOSIS — E291 Testicular hypofunction: Secondary | ICD-10-CM | POA: Diagnosis not present

## 2018-04-20 DIAGNOSIS — I10 Essential (primary) hypertension: Secondary | ICD-10-CM | POA: Diagnosis not present

## 2018-05-19 DIAGNOSIS — H02052 Trichiasis without entropian right lower eyelid: Secondary | ICD-10-CM | POA: Diagnosis not present

## 2018-05-19 DIAGNOSIS — H01022 Squamous blepharitis right lower eyelid: Secondary | ICD-10-CM | POA: Diagnosis not present

## 2018-05-19 DIAGNOSIS — H01021 Squamous blepharitis right upper eyelid: Secondary | ICD-10-CM | POA: Diagnosis not present

## 2018-08-10 DIAGNOSIS — Z23 Encounter for immunization: Secondary | ICD-10-CM | POA: Diagnosis not present

## 2018-10-18 DIAGNOSIS — Z0000 Encounter for general adult medical examination without abnormal findings: Secondary | ICD-10-CM | POA: Diagnosis not present

## 2018-10-18 DIAGNOSIS — Z125 Encounter for screening for malignant neoplasm of prostate: Secondary | ICD-10-CM | POA: Diagnosis not present

## 2018-10-21 DIAGNOSIS — Z0001 Encounter for general adult medical examination with abnormal findings: Secondary | ICD-10-CM | POA: Diagnosis not present

## 2018-10-21 DIAGNOSIS — Z23 Encounter for immunization: Secondary | ICD-10-CM | POA: Diagnosis not present

## 2018-10-21 DIAGNOSIS — E7800 Pure hypercholesterolemia, unspecified: Secondary | ICD-10-CM | POA: Diagnosis not present

## 2018-10-21 DIAGNOSIS — I10 Essential (primary) hypertension: Secondary | ICD-10-CM | POA: Diagnosis not present

## 2018-10-21 DIAGNOSIS — Z1212 Encounter for screening for malignant neoplasm of rectum: Secondary | ICD-10-CM | POA: Diagnosis not present

## 2018-11-04 ENCOUNTER — Encounter: Payer: Self-pay | Admitting: Emergency Medicine

## 2018-11-04 DIAGNOSIS — F1020 Alcohol dependence, uncomplicated: Secondary | ICD-10-CM | POA: Insufficient documentation

## 2018-11-04 HISTORY — DX: Alcohol dependence, uncomplicated: F10.20

## 2018-11-24 ENCOUNTER — Encounter: Payer: Self-pay | Admitting: Psychiatry

## 2018-11-24 ENCOUNTER — Ambulatory Visit: Payer: 59 | Admitting: Psychiatry

## 2018-11-24 DIAGNOSIS — F1021 Alcohol dependence, in remission: Secondary | ICD-10-CM | POA: Diagnosis not present

## 2018-11-24 DIAGNOSIS — F331 Major depressive disorder, recurrent, moderate: Secondary | ICD-10-CM | POA: Diagnosis not present

## 2018-11-24 MED ORDER — ACAMPROSATE CALCIUM 333 MG PO TBEC: 666.0000 mg | DELAYED_RELEASE_TABLET | Freq: Three times a day (TID) | ORAL | 5 refills | Status: DC

## 2018-11-24 MED ORDER — NALTREXONE HCL 50 MG PO TABS: 50.0000 mg | ORAL_TABLET | Freq: Every day | ORAL | 5 refills | Status: DC

## 2018-11-24 MED ORDER — DIVALPROEX SODIUM 500 MG PO DR TAB: 1500.0000 mg | DELAYED_RELEASE_TABLET | Freq: Every day | ORAL | 5 refills | Status: DC

## 2018-11-24 MED ORDER — DULOXETINE HCL 30 MG PO CSDR: 90.0000 mg | DELAYED_RELEASE_CAPSULE | Freq: Every day | ORAL | 5 refills | Status: DC

## 2018-11-24 NOTE — Progress Notes (Signed)
[name] [id number] [birth year] 58 y.o.  Subjective:   Patient ID:  Chad Reynolds is a 58 y.o. (DOB [birth year]) male.  Chief Complaint:  Chief Complaint  Patient presents with  . Follow-up    Medication Management    HPI AVISHAI Reynolds presents to the office today for follow-up of bipolar disorder and alcohold dependence vs bipolar mixed.  Good except health issues with father.  Stressful.  Meds help with the anxiety related.  Busy season.  Handles ok usually.    Mood stable and pleased with meds.  Patient reports stable mood and denies depressed or irritable moods.  Patient denies any recent difficulty with anxiety.  Patient denies difficulty with sleep initiation or maintenance. Denies appetite disturbance.  Patient reports that energy and motivation have been good.  Patient denies any difficulty with concentration.  Patient denies any suicidal ideation.  He denies alcohol abuse currently.  Past Psychiatric Medications: Lexapro and Viibyrd Review of Systems:  Review of Systems  Neurological: Negative for tremors and weakness.  Psychiatric/Behavioral: Negative for agitation, behavioral problems, confusion, decreased concentration, dysphoric mood, hallucinations, self-injury, sleep disturbance and suicidal ideas. The patient is not nervous/anxious and is not hyperactive.     Medications: I have reviewed the patient's current medications.  Current Outpatient Medications  Medication Sig Dispense Refill  . acamprosate (CAMPRAL) 333 MG tablet Take 2 tablets (666 mg total) by mouth 3 (three) times daily. 180 tablet 5  . Azilsartan Medoxomil (EDARBI) 40 MG TABS Take 40 mg by mouth daily.    . divalproex (DEPAKOTE) 500 MG DR tablet Take 3 tablets (1,500 mg total) by mouth at bedtime. 90 tablet 5  . DULoxetine HCl 30 MG CSDR Take 90 mg by mouth daily. 90 capsule 5  . naltrexone (DEPADE) 50 MG tablet Take 1 tablet (50 mg total) by mouth daily. 30 tablet 5   No current  facility-administered medications for this visit.     Medication Side Effects: None  Allergies: No Known Allergies  Past Medical History:  Diagnosis Date  . Anxiety   . Hypertension     Family History  Problem Relation Age of Onset  . Tremor Mother   . Diabetes Mother   . Diabetes Father   . Atrial fibrillation Father   . Heart disease Father   . Lymphoma Father   . Tremor Maternal Grandmother     Social History   Socioeconomic History  . Marital status: Unknown    Spouse name: Not on file  . Number of children: 2  . Years of education: BA  . Highest education level: Not on file  Occupational History  . Occupation: Surveyor, quantity  . Financial resource strain: Not on file  . Food insecurity:    Worry: Not on file    Inability: Not on file  . Transportation needs:    Medical: Not on file    Non-medical: Not on file  Tobacco Use  . Smoking status: Never Smoker  . Smokeless tobacco: Never Used  Substance and Sexual Activity  . Alcohol use: Yes  . Drug use: No  . Sexual activity: Not on file  Lifestyle  . Physical activity:    Days per week: Not on file    Minutes per session: Not on file  . Stress: Not on file  Relationships  . Social connections:    Talks on phone: Not on file    Gets together: Not on file    Attends religious  service: Not on file    Active member of club or organization: Not on file    Attends meetings of clubs or organizations: Not on file    Relationship status: Not on file  . Intimate partner violence:    Fear of current or ex partner: Not on file    Emotionally abused: Not on file    Physically abused: Not on file    Forced sexual activity: Not on file  Other Topics Concern  . Not on file  Social History Narrative   Lives with wife   Caffeine use: daily   Right handed     Past Medical History, Surgical history, Social history, and Family history were reviewed and updated as appropriate.   Please see review of  systems for further details on the patient's review from today.   Objective:   Physical Exam:  There were no vitals taken for this visit.  Physical Exam Constitutional:      General: He is not in acute distress.    Appearance: He is well-developed.  Musculoskeletal:        General: No deformity.  Neurological:     Mental Status: He is alert and oriented to person, place, and time.     Motor: No tremor.     Coordination: Coordination normal.     Gait: Gait normal.  Psychiatric:        Attention and Perception: Attention and perception normal.        Mood and Affect: Mood is not anxious or depressed. Affect is not labile, blunt, angry or inappropriate.        Speech: Speech normal.        Behavior: Behavior normal.        Thought Content: Thought content normal. Thought content does not include homicidal or suicidal ideation. Thought content does not include homicidal or suicidal plan.        Cognition and Memory: Cognition normal.        Judgment: Judgment normal.     Comments: Insight intact. No auditory or visual hallucinations. No delusions.      Lab Review:     Component Value Date/Time   NA 138 09/22/2011 1044   K 3.7 09/22/2011 1044   CL 100 09/22/2011 1044   CO2 24 09/22/2011 1044   GLUCOSE 117 (H) 09/22/2011 1044   BUN 17 09/22/2011 1044   CREATININE 0.87 09/22/2011 1044   CALCIUM 8.9 09/22/2011 1044   PROT 6.8 09/22/2011 1044   ALBUMIN 3.6 09/22/2011 1044   AST 32 09/22/2011 1044   ALT 30 09/22/2011 1044   ALKPHOS 49 09/22/2011 1044   BILITOT 0.3 09/22/2011 1044   GFRNONAA >90 09/22/2011 1044   GFRAA >90 09/22/2011 1044       Component Value Date/Time   WBC 4.2 09/22/2011 1044   RBC 4.39 09/22/2011 1044   HGB 14.1 09/22/2011 1044   HCT 39.2 09/22/2011 1044   PLT 209 09/22/2011 1044   MCV 89.3 09/22/2011 1044   MCH 32.1 09/22/2011 1044   MCHC 36.0 09/22/2011 1044   RDW 11.6 09/22/2011 1044    No results found for: POCLITH, LITHIUM   No results  found for: PHENYTOIN, PHENOBARB, VALPROATE, CBMZ   .res Assessment: Plan:    Major depressive disorder, recurrent episode, moderate ([facility])  Alcohol dependence in remission ([place])   His mood symptoms remain under control with antidepressant helping the actual depression and the Depakote being helpful at managing unusual levels of irritability and aggressiveness.  He is remained sober and the craving is minimal for alcohol.  Denies abuse of alcohol.  Tolerating medications and no indication for change.  Encouraged continued sobriety and compliance.  Follow-up 6 months  Lynder Parents, MD, DFAPA  Please see After Visit Summary for patient specific instructions.  No future appointments.  No orders of the defined types were placed in this encounter.     -------------------------------

## 2019-01-24 DIAGNOSIS — Z23 Encounter for immunization: Secondary | ICD-10-CM | POA: Diagnosis not present

## 2019-03-23 DIAGNOSIS — D124 Benign neoplasm of descending colon: Secondary | ICD-10-CM | POA: Diagnosis not present

## 2019-03-23 DIAGNOSIS — D125 Benign neoplasm of sigmoid colon: Secondary | ICD-10-CM | POA: Diagnosis not present

## 2019-03-23 DIAGNOSIS — Z86010 Personal history of colonic polyps: Secondary | ICD-10-CM | POA: Diagnosis not present

## 2019-05-06 ENCOUNTER — Ambulatory Visit: Payer: 59 | Admitting: Psychiatry

## 2019-05-06 ENCOUNTER — Encounter: Payer: Self-pay | Admitting: Psychiatry

## 2019-05-06 ENCOUNTER — Other Ambulatory Visit: Payer: Self-pay | Admitting: Psychiatry

## 2019-05-06 ENCOUNTER — Other Ambulatory Visit: Payer: Self-pay

## 2019-05-06 DIAGNOSIS — F1021 Alcohol dependence, in remission: Secondary | ICD-10-CM | POA: Diagnosis not present

## 2019-05-06 DIAGNOSIS — F3341 Major depressive disorder, recurrent, in partial remission: Secondary | ICD-10-CM | POA: Diagnosis not present

## 2019-05-06 MED ORDER — DIVALPROEX SODIUM 500 MG PO DR TAB: 1500.0000 mg | DELAYED_RELEASE_TABLET | Freq: Every day | ORAL | 3 refills | Status: DC

## 2019-05-06 MED ORDER — ACAMPROSATE CALCIUM 333 MG PO TBEC: 666.0000 mg | DELAYED_RELEASE_TABLET | Freq: Three times a day (TID) | ORAL | 3 refills | Status: DC

## 2019-05-06 MED ORDER — DULOXETINE HCL 30 MG PO CSDR: 90.0000 mg | DELAYED_RELEASE_CAPSULE | Freq: Every day | ORAL | 3 refills | Status: DC

## 2019-05-06 MED ORDER — NALTREXONE HCL 50 MG PO TABS: 50.0000 mg | ORAL_TABLET | Freq: Every day | ORAL | 3 refills | Status: DC

## 2019-05-06 NOTE — Progress Notes (Signed)
Chad Reynolds [id number] Jul 16, 1961 58 y.o.  Subjective:   Patient ID:  Chad Reynolds is a 58 y.o. (DOB 07/17/1961) male.  Chief Complaint:  Chief Complaint  Patient presents with  . Follow-up    mood, alcohol and med management    HPI Chad Reynolds presents to the office today for follow-up of alcohol dependence and major depression mixed versus irritable bipolar disorder.  Last visit was January 2020.  he was doing well.  No meds were changed.  He was taking duloxetine 90 mg, Depakote ER 1500 mg, naltrexone, and Campral.  Ok overall with everything going on.  Work affected by Chad Reynolds. Patient reports stable mood and denies depressed or irritable moods.  Patient denies any recent difficulty with anxiety.  Patient has occ difficulty with sleep initiation or maintenance. Denies appetite disturbance.  Patient reports that energy and motivation have been good.  Patient denies any difficulty with concentration.  Patient denies any suicidal ideation.  Sometimes crazy dreams.  Occ beer without excess now.  No longer hangs out with friends who drink to excess.  Past Psychiatric Medication Trials: Duloxetine 90, Lexapro, Viibryd, Depakote 1500  Review of Systems:  Review of Systems  Neurological: Negative for tremors and weakness.    Medications: I have reviewed the patient's current medications.  Current Outpatient Medications  Medication Sig Dispense Refill  . acamprosate (CAMPRAL) 333 MG tablet Take 2 tablets (666 mg total) by mouth 3 (three) times daily. 180 tablet 5  . Azilsartan Medoxomil (EDARBI) 40 MG TABS Take 40 mg by mouth daily.    . divalproex (DEPAKOTE) 500 MG DR tablet Take 3 tablets (1,500 mg total) by mouth at bedtime. 90 tablet 5  . DULoxetine HCl 30 MG CSDR Take 90 mg by mouth daily. 90 capsule 5  . naltrexone (DEPADE) 50 MG tablet Take 1 tablet (50 mg total) by mouth daily. 30 tablet 5   No current facility-administered medications for this visit.     Medication  Side Effects: None  Allergies: No Known Allergies  Past Medical History:  Diagnosis Date  . Anxiety   . Hypertension     Family History  Problem Relation Age of Onset  . Tremor Mother   . Diabetes Mother   . Diabetes Father   . Atrial fibrillation Father   . Heart disease Father   . Lymphoma Father   . Tremor Maternal Grandmother     Social History   Socioeconomic History  . Marital status: Unknown    Spouse name: Not on file  . Number of children: 2  . Years of education: BA  . Highest education level: Not on file  Occupational History  . Occupation: Surveyor, quantity  . Financial resource strain: Not on file  . Food insecurity    Worry: Not on file    Inability: Not on file  . Transportation needs    Medical: Not on file    Non-medical: Not on file  Tobacco Use  . Smoking status: Never Smoker  . Smokeless tobacco: Never Used  Substance and Sexual Activity  . Alcohol use: Yes  . Drug use: No  . Sexual activity: Not on file  Lifestyle  . Physical activity    Days per week: Not on file    Minutes per session: Not on file  . Stress: Not on file  Relationships  . Social Herbalist on phone: Not on file    Gets together: Not on  file    Attends religious service: Not on file    Active member of club or organization: Not on file    Attends meetings of clubs or organizations: Not on file    Relationship status: Not on file  . Intimate partner violence    Fear of current or ex partner: Not on file    Emotionally abused: Not on file    Physically abused: Not on file    Forced sexual activity: Not on file  Other Topics Concern  . Not on file  Social History Narrative   Lives with wife   Caffeine use: daily   Right handed     Past Medical History, Surgical history, Social history, and Family history were reviewed and updated as appropriate.   Please see review of systems for further details on the patient's review from today.    Objective:   Physical Exam:  There were no vitals taken for this visit.  Physical Exam Constitutional:      General: He is not in acute distress.    Appearance: He is well-developed.  Musculoskeletal:        General: No deformity.  Neurological:     Mental Status: He is alert and oriented to person, place, and time.     Coordination: Coordination normal.  Psychiatric:        Attention and Perception: He is attentive. He does not perceive auditory hallucinations.        Mood and Affect: Mood is not anxious or depressed. Affect is not labile, blunt, angry or inappropriate.        Speech: Speech normal.        Behavior: Behavior normal.        Thought Content: Thought content normal. Thought content does not include homicidal or suicidal ideation. Thought content does not include homicidal or suicidal plan.        Cognition and Memory: Cognition normal.        Judgment: Judgment normal.     Comments: Insight intact. No auditory or visual hallucinations. No delusions.      Lab Review:     Component Value Date/Time   NA 138 09/22/2011 1044   K 3.7 09/22/2011 1044   CL 100 09/22/2011 1044   CO2 24 09/22/2011 1044   GLUCOSE 117 (H) 09/22/2011 1044   BUN 17 09/22/2011 1044   CREATININE 0.87 09/22/2011 1044   CALCIUM 8.9 09/22/2011 1044   PROT 6.8 09/22/2011 1044   ALBUMIN 3.6 09/22/2011 1044   AST 32 09/22/2011 1044   ALT 30 09/22/2011 1044   ALKPHOS 49 09/22/2011 1044   BILITOT 0.3 09/22/2011 1044   GFRNONAA >90 09/22/2011 1044   GFRAA >90 09/22/2011 1044       Component Value Date/Time   WBC 4.2 09/22/2011 1044   RBC 4.39 09/22/2011 1044   HGB 14.1 09/22/2011 1044   HCT 39.2 09/22/2011 1044   PLT 209 09/22/2011 1044   MCV 89.3 09/22/2011 1044   MCH 32.1 09/22/2011 1044   MCHC 36.0 09/22/2011 1044   RDW 11.6 09/22/2011 1044    No results found for: POCLITH, LITHIUM   No results found for: PHENYTOIN, PHENOBARB, VALPROATE, CBMZ   .res Assessment: Plan:     Chad Reynolds was seen today for follow-up.  Diagnoses and all orders for this visit:  Depression, major, recurrent, in partial remission ([place]) -     DULoxetine HCl 30 MG CSDR; Take 90 mg by mouth daily. -  divalproex (DEPAKOTE) 500 MG DR tablet; Take 3 tablets (1,500 mg total) by mouth at bedtime.  Alcohol dependence in remission ([place]) -     acamprosate (CAMPRAL) 333 MG tablet; Take 2 tablets (666 mg total) by mouth 3 (three) times daily. -     naltrexone (DEPADE) 50 MG tablet; Take 1 tablet (50 mg total) by mouth daily.   Chad Reynolds has had a good response to a combination of duloxetine plus Depakote.  Depakote was added for irritability which was not adequately addressed by the duloxetine.  His depression is under control.  His irritability is under control.  His wife has had a lot of input and she agrees with a good response.  Chad Reynolds is also been able to greatly reduce his alcohol intake and appears to no longer be abusing alcohol.  His wife would like him to completely stop which she has not done but he is able to go for days sometimes weeks at a time with no alcohol.  No indication for medication changes  Follow-up 6 months or sooner as needed  Lynder Parents MD, DFAPA  Please see After Visit Summary for patient specific instructions.  No future appointments.  No orders of the defined types were placed in this encounter.   -------------------------------

## 2019-05-25 ENCOUNTER — Ambulatory Visit: Payer: 59 | Admitting: Psychiatry

## 2019-06-28 ENCOUNTER — Telehealth: Payer: Self-pay

## 2019-06-28 NOTE — Telephone Encounter (Signed)
Prior authorization submitted and approved for Duloxetine 30 mg #270 effective 06/28/2019-06/27/2020 through covermymeds with [employer]

## 2019-10-13 ENCOUNTER — Encounter (HOSPITAL_COMMUNITY): Payer: Self-pay

## 2019-10-13 ENCOUNTER — Emergency Department (HOSPITAL_COMMUNITY): Payer: 59

## 2019-10-13 ENCOUNTER — Other Ambulatory Visit: Payer: Self-pay

## 2019-10-13 ENCOUNTER — Inpatient Hospital Stay (HOSPITAL_COMMUNITY)
Admission: EM | Admit: 2019-10-13 | Discharge: 2019-10-16 | DRG: 178 | Disposition: A | Discharge status: Home / Self Care | Payer: 59 | Attending: Internal Medicine | Admitting: Internal Medicine

## 2019-10-13 DIAGNOSIS — R0602 Shortness of breath: Secondary | ICD-10-CM | POA: Diagnosis not present

## 2019-10-13 DIAGNOSIS — I951 Orthostatic hypotension: Secondary | ICD-10-CM | POA: Diagnosis present

## 2019-10-13 DIAGNOSIS — F419 Anxiety disorder, unspecified: Secondary | ICD-10-CM | POA: Diagnosis present

## 2019-10-13 DIAGNOSIS — E860 Dehydration: Secondary | ICD-10-CM | POA: Diagnosis present

## 2019-10-13 DIAGNOSIS — F3130 Bipolar disorder, current episode depressed, mild or moderate severity, unspecified: Secondary | ICD-10-CM | POA: Diagnosis present

## 2019-10-13 DIAGNOSIS — U071 COVID-19: Principal | ICD-10-CM | POA: Diagnosis present

## 2019-10-13 DIAGNOSIS — Z807 Family history of other malignant neoplasms of lymphoid, hematopoietic and related tissues: Secondary | ICD-10-CM

## 2019-10-13 DIAGNOSIS — I959 Hypotension, unspecified: Secondary | ICD-10-CM | POA: Diagnosis present

## 2019-10-13 DIAGNOSIS — F319 Bipolar disorder, unspecified: Secondary | ICD-10-CM | POA: Diagnosis present

## 2019-10-13 DIAGNOSIS — Z79899 Other long term (current) drug therapy: Secondary | ICD-10-CM

## 2019-10-13 DIAGNOSIS — Z791 Long term (current) use of non-steroidal anti-inflammatories (NSAID): Secondary | ICD-10-CM

## 2019-10-13 DIAGNOSIS — I10 Essential (primary) hypertension: Secondary | ICD-10-CM | POA: Diagnosis present

## 2019-10-13 DIAGNOSIS — R0902 Hypoxemia: Secondary | ICD-10-CM | POA: Diagnosis present

## 2019-10-13 DIAGNOSIS — R001 Bradycardia, unspecified: Secondary | ICD-10-CM | POA: Diagnosis present

## 2019-10-13 DIAGNOSIS — J9811 Atelectasis: Secondary | ICD-10-CM | POA: Diagnosis present

## 2019-10-13 DIAGNOSIS — E876 Hypokalemia: Secondary | ICD-10-CM | POA: Diagnosis present

## 2019-10-13 DIAGNOSIS — R630 Anorexia: Secondary | ICD-10-CM | POA: Diagnosis present

## 2019-10-13 DIAGNOSIS — Z8249 Family history of ischemic heart disease and other diseases of the circulatory system: Secondary | ICD-10-CM

## 2019-10-13 DIAGNOSIS — Z833 Family history of diabetes mellitus: Secondary | ICD-10-CM

## 2019-10-13 HISTORY — DX: Bipolar disorder, unspecified: F31.9

## 2019-10-13 LAB — CBC WITH DIFFERENTIAL/PLATELET
Abs Immature Granulocytes: 0.02 10*3/uL (ref 0.00–0.07)
Basophils Absolute: 0 10*3/uL (ref 0.0–0.1)
Basophils Relative: 1 %
Eosinophils Absolute: 0.1 10*3/uL (ref 0.0–0.5)
Eosinophils Relative: 1 %
HCT: 37.8 % — ABNORMAL LOW (ref 39.0–52.0)
Hemoglobin: 14 g/dL (ref 13.0–17.0)
Immature Granulocytes: 0 %
Lymphocytes Relative: 31 %
Lymphs Abs: 2.2 10*3/uL (ref 0.7–4.0)
MCH: 34.1 pg — ABNORMAL HIGH (ref 26.0–34.0)
MCHC: 37 g/dL — ABNORMAL HIGH (ref 30.0–36.0)
MCV: 92.2 fL (ref 80.0–100.0)
Monocytes Absolute: 0.8 10*3/uL (ref 0.1–1.0)
Monocytes Relative: 11 %
Neutro Abs: 4 10*3/uL (ref 1.7–7.7)
Neutrophils Relative %: 56 %
Platelets: 234 10*3/uL (ref 150–400)
RBC: 4.1 MIL/uL — ABNORMAL LOW (ref 4.22–5.81)
RDW: 11.9 % (ref 11.5–15.5)
WBC: 7.1 10*3/uL (ref 4.0–10.5)
nRBC: 0 % (ref 0.0–0.2)

## 2019-10-13 LAB — PROTIME-INR
INR: 0.9 (ref 0.8–1.2)
Prothrombin Time: 12.5 seconds (ref 11.4–15.2)

## 2019-10-13 LAB — COMPREHENSIVE METABOLIC PANEL
ALT: 31 U/L (ref 0–44)
AST: 28 U/L (ref 15–41)
Albumin: 4 g/dL (ref 3.5–5.0)
Alkaline Phosphatase: 37 U/L — ABNORMAL LOW (ref 38–126)
Anion gap: 14 (ref 5–15)
BUN: 23 mg/dL — ABNORMAL HIGH (ref 6–20)
CO2: 31 mmol/L (ref 22–32)
Calcium: 9.1 mg/dL (ref 8.9–10.3)
Chloride: 93 mmol/L — ABNORMAL LOW (ref 98–111)
Creatinine, Ser: 1.11 mg/dL (ref 0.61–1.24)
GFR calc Af Amer: >60 mL/min (ref >60)
GFR calc non Af Amer: >60 mL/min (ref >60)
Glucose, Bld: 112 mg/dL — ABNORMAL HIGH (ref 70–99)
Potassium: 3.3 mmol/L — ABNORMAL LOW (ref 3.5–5.1)
Sodium: 138 mmol/L (ref 135–145)
Total Bilirubin: 1.1 mg/dL (ref 0.3–1.2)
Total Protein: 7.4 g/dL (ref 6.5–8.1)

## 2019-10-13 LAB — LACTIC ACID, PLASMA
Lactic Acid, Venous: 1.8 mmol/L (ref 0.5–1.9)
Lactic Acid, Venous: 1.3 mmol/L (ref 0.5–1.9)

## 2019-10-13 LAB — TROPONIN I (HIGH SENSITIVITY)
Troponin I (High Sensitivity): 2 ng/L (ref <18)
Troponin I (High Sensitivity): <2 ng/L (ref <18)

## 2019-10-13 MED ORDER — ACETAMINOPHEN 325 MG PO TABS
650.0000 mg | ORAL_TABLET | Freq: Once | ORAL | Status: AC
  Administered 2019-10-13: 650 mg via ORAL
  Filled 2019-10-13: qty 2

## 2019-10-13 MED ORDER — SODIUM CHLORIDE 0.9 % IV BOLUS
500.0000 mL | Freq: Once | INTRAVENOUS | Status: AC
  Administered 2019-10-13: 500 mL via INTRAVENOUS

## 2019-10-13 NOTE — ED Provider Notes (Signed)
[facility] DEPT Provider Note   CSN: [id number] Arrival date & time: 10/13/19  1458     History   Chief Complaint Chief Complaint  Patient presents with  . Covid positive  . Chest Pain  . Shortness of Breath    HPI Chad Reynolds is a 58 y.o. male.     HPI   Chad Reynolds is a 58 y.o. male, with a history of HTN and anxiety, presenting to the ED with fatigue for the past week. Also notes shortness of breath, soft stools, loss of appetite. Fever noted today. Tested positive for COVID-19 Nov 28. Began to have back discomfort, left upper back, intermittent, 7/10, described as "an annoying discomfort," nonradiating. No change with movement, deep breathing. Does seem to arise when lying on his back. Measured his SPO2 at home and noted it to be low 90s at rest and high 80s when walking. No current complaints other than fatigue during my interview. Called his doctor and was advised to come be evaluated.  Denies cough, syncope, abdominal pain, N/V, leg swelling/pain, or any other complaints.    Past Medical History:  Diagnosis Date  . Anxiety   . Hypertension     Patient Active Problem List   Diagnosis Date Noted  . COVID-19 virus infection 10/14/2019  . Hypotension 10/14/2019  . Bipolar 1 disorder, depressed ([place]) 10/13/2019  . EtOH dependence ([place]) 11/04/2018    Past Surgical History:  Procedure Laterality Date  . CATARACT EXTRACTION Bilateral         Home Medications    Prior to Admission medications   Medication Sig Start Date End Date Taking? Authorizing Provider  Azilsartan Medoxomil (EDARBI) 40 MG TABS Take 40 mg by mouth daily.   Yes [provider]  clobetasol cream (TEMOVATE) [id number] % Apply 1 application topically 2 (two) times daily as needed (dry skin).  08/17/19  Yes [provider]  divalproex (DEPAKOTE ER) 500 MG 24 hr tablet Take 500 mg by mouth daily.  09/27/19  Yes [provider]  DULoxetine  (CYMBALTA) 30 MG capsule Take 90 mg by mouth daily.  09/27/19  Yes [provider]  EDARBYCLOR 40-25 MG TABS Take 1 tablet by mouth daily. 09/27/19  Yes [provider]  ibuprofen (ADVIL) 200 MG tablet Take 400 mg by mouth every 6 (six) hours as needed for moderate pain.   Yes [provider]  naltrexone (DEPADE) 50 MG tablet Take 1 tablet (50 mg total) by mouth daily. 05/06/19  Yes Cottle, Billey Co., MD  acamprosate (CAMPRAL) 333 MG tablet Take 2 tablets (666 mg total) by mouth 3 (three) times daily. Patient not taking: Reported on 10/13/2019 05/06/19   Purnell Shoemaker., MD  divalproex (DEPAKOTE) 500 MG DR tablet Take 3 tablets (1,500 mg total) by mouth at bedtime. Patient not taking: Reported on 10/13/2019 05/06/19   Purnell Shoemaker., MD  DULoxetine HCl 30 MG CSDR Take 90 mg by mouth daily. Patient not taking: Reported on 10/13/2019 05/06/19   Cottle, Billey Co., MD    Family History Family History  Problem Relation Age of Onset  . Tremor Mother   . Diabetes Mother   . Diabetes Father   . Atrial fibrillation Father   . Heart disease Father   . Lymphoma Father   . Tremor Maternal Grandmother     Social History Social History   Tobacco Use  . Smoking status: Never Smoker  . Smokeless tobacco:  Never Used  Substance Use Topics  . Alcohol use: Yes  . Drug use: No     Allergies   Patient has no known allergies.   Review of Systems Review of Systems  Constitutional: Positive for activity change, appetite change, fatigue and fever.  Respiratory: Positive for shortness of breath. Negative for cough.   Cardiovascular: Negative for chest pain and leg swelling.  Gastrointestinal: Positive for diarrhea. Negative for abdominal pain, nausea and vomiting.  Musculoskeletal: Positive for back pain.  Neurological: Negative for dizziness and syncope.  All other systems reviewed and are negative.    Physical Exam Updated Vital Signs BP 110/76 (BP  Location: Left Arm)   Pulse 81   Temp (!) 101 F (38.3 C) (Rectal)   Resp (!) 24   Ht 5\' 9"  (1.753 m)   Wt 96.2 kg   SpO2 92%   BMI 31.31 kg/m   Physical Exam Vitals signs and nursing note reviewed.  Constitutional:      General: He is not in acute distress.    Appearance: He is well-developed. He is not diaphoretic.  HENT:     Head: Normocephalic and atraumatic.     Mouth/Throat:     Mouth: Mucous membranes are moist.     Pharynx: Oropharynx is clear.  Eyes:     Conjunctiva/sclera: Conjunctivae normal.  Neck:     Musculoskeletal: Neck supple.  Cardiovascular:     Rate and Rhythm: Normal rate and regular rhythm.     Pulses: Normal pulses.          Radial pulses are 2+ on the right side and 2+ on the left side.       Posterior tibial pulses are 2+ on the right side and 2+ on the left side.     Heart sounds: Normal heart sounds.     Comments: Tactile temperature in the extremities appropriate and equal bilaterally. Pulmonary:     Effort: Pulmonary effort is normal. No respiratory distress.     Breath sounds: Normal breath sounds.  Abdominal:     Palpations: Abdomen is soft.     Tenderness: There is no abdominal tenderness. There is no guarding.  Musculoskeletal:     Right lower leg: No edema.     Left lower leg: No edema.  Lymphadenopathy:     Cervical: No cervical adenopathy.  Skin:    General: Skin is warm and dry.  Neurological:     Mental Status: He is alert.  Psychiatric:        Mood and Affect: Mood and affect normal.        Speech: Speech normal.        Behavior: Behavior normal.      ED Treatments / Results  Labs (all labs ordered are listed, but only abnormal results are displayed) Labs Reviewed  COMPREHENSIVE METABOLIC PANEL - Abnormal; Notable for the following components:      Result Value   Potassium 3.3 (*)    Chloride 93 (*)    Glucose, Bld 112 (*)    BUN 23 (*)    Alkaline Phosphatase 37 (*)    All other components within normal limits   CBC WITH DIFFERENTIAL/PLATELET - Abnormal; Notable for the following components:   RBC 4.10 (*)    HCT 37.8 (*)    MCH 34.1 (*)    MCHC 37.0 (*)    All other components within normal limits  CULTURE, BLOOD (ROUTINE X 2)  CULTURE, BLOOD (ROUTINE X 2)  LACTIC ACID, PLASMA  LACTIC ACID, PLASMA  PROTIME-INR  LACTATE DEHYDROGENASE  URINALYSIS, ROUTINE W REFLEX MICROSCOPIC  D-DIMER, QUANTITATIVE (NOT AT [facility])  PROCALCITONIN  FERRITIN  TRIGLYCERIDES  FIBRINOGEN  C-REACTIVE PROTEIN  HIV ANTIBODY (ROUTINE TESTING W REFLEX)  INFLUENZA PANEL BY PCR (TYPE A & B)  POC SARS CORONAVIRUS 2 AG -  ED  ABO/RH  TROPONIN I (HIGH SENSITIVITY)  TROPONIN I (HIGH SENSITIVITY)    EKG EKG Interpretation  Date/Time:  Thursday October 13 2019 15:35:43 EST Ventricular Rate:  71 PR Interval:    QRS Duration: 114 QT Interval:  427 QTC Calculation: 464 R Axis:   49 Text Interpretation: Sinus rhythm Anteroseptal infarct, age indeterminate since last tracing no significant change Confirmed by Daleen Bo [phone]) on 10/13/2019 3:57:31 PM   Radiology Dg Chest Portable 1 View  Result Date: 10/13/2019 CLINICAL DATA:  58 year old male with sepsis. EXAM: PORTABLE CHEST 1 VIEW COMPARISON:  Chest radiograph dated 09/22/2011. FINDINGS: There is shallow inspiration with bibasilar linear and platelike atelectasis. No focal consolidation, pleural effusion, or pneumothorax. The cardiac silhouette is within normal limits. Old healed right rib fractures. No acute osseous pathology. IMPRESSION: Shallow inspiration with bibasilar atelectasis. No focal consolidation. Electronically Signed   By: Anner Crete M.D.   On: 10/13/2019 16:59    Procedures Ultrasound ED Peripheral IV (Provider)  Date/Time: 10/13/2019 6:35 PM Performed by: Lorayne Bender, PA-C Authorized by: Lorayne Bender, PA-C   Procedure details:    Indications: hydration, hypotension and poor IV access     Skin Prep: chlorhexidine gluconate      Location:  Left AC   Angiocath:  20 G   Bedside Ultrasound Guided: Yes     Images: not archived     Patient tolerated procedure without complications: Yes     Dressing applied: Yes   Comments:     Positive flash.  Advanced and flushed without pain, swelling, or other signs of infiltration.   (including critical care time)  Medications Ordered in ED Medications  Azilsartan-Chlorthalidone 40-25 MG TABS 1 tablet (has no administration in time range)  DULoxetine (CYMBALTA) DR capsule 90 mg (has no administration in time range)  naltrexone (DEPADE) tablet 50 mg (has no administration in time range)  divalproex (DEPAKOTE ER) 24 hr tablet 500 mg (has no administration in time range)  enoxaparin (LOVENOX) injection 40 mg (has no administration in time range)  0.45 % sodium chloride infusion (has no administration in time range)  acetaminophen (TYLENOL) tablet 650 mg (has no administration in time range)  zolpidem (AMBIEN) tablet 5 mg (has no administration in time range)  ondansetron (ZOFRAN) tablet 4 mg (has no administration in time range)    Or  ondansetron (ZOFRAN) injection 4 mg (has no administration in time range)  acetaminophen (TYLENOL) tablet 650 mg (650 mg Oral Given 10/13/19 1845)  sodium chloride 0.9 % bolus 500 mL (0 mLs Intravenous Stopped 10/13/19 2031)  sodium chloride 0.9 % bolus 500 mL (0 mLs Intravenous Stopped 10/13/19 2353)     Initial Impression / Assessment and Plan / ED Course  I have reviewed the triage vital signs and the nursing notes.  Pertinent labs & imaging results that were available during my care of the patient were reviewed by me and considered in my medical decision making (see chart for details).  Clinical Course as of Oct 14 27  Thu Oct 13, 2019  2333 Spoke with Dr. Linda Hedges, hospitalist. Agrees to admit the patient. Requests   [SJ]  Clinical Course User Index [SJ] ,  C, PA-C       Patient presents with shortness of breath and fever.   Patient tested positive for COVID-19 on November 28. Nontoxic-appearing, but febrile.  SPO2 at rest 91 to 94%, but dropped to 88% with ambulation.  No tachycardia.  No leukocytosis. Patient with mild hypotension when compared to previous blood pressures prior to this visit.  Suspect this may be due to poor oral intake and dehydration.  Mild BUN elevation. Patient admitted for further management due to his hypoxia and hypotension.  Findings and plan of care discussed with Daleen Bo, MD.   Vitals:   10/13/19 2141 10/13/19 2200 10/13/19 2230 10/13/19 2342  BP: 96/64 93/62 (!) 96/58 (!) 97/56  Pulse: (!) 57 62 60 62  Resp:  (!) 25 (!) 26 (!) 25  Temp:      TempSrc:      SpO2: 95% 93% 91% 96%  Weight:      Height:         Final Clinical Impressions(s) / ED Diagnoses   Final diagnoses:  COVID-19  Shortness of breath  Hypoxia    ED Discharge Orders    None       Layla Maw 10/14/19 0033    Daleen Bo, MD 10/14/19 1202

## 2019-10-13 NOTE — ED Triage Notes (Signed)
Patient presents with Chest pain "through to my back" and SOB. Patient tested positive for Covid 19 on Saturday 10/08/19, and first noted symptoms on Thursday 10/06/19. Patient reports increased chest pain radiating to his back this morning. Patient reports increased SOB, dyspnea with exertion. Patient reports oxygen saturations in low 90s sitting and in mid 80%s ambulating at home. Patient endorses nausea, "some diarrhea," and decreased appetite. Patient reports fever at home, and has been taking tylenol and ibuprofen for symptom management. Patient endorses headache. Patient speaking in full sentences in triage with no apparent distress. EKG completed in triage.

## 2019-10-13 NOTE — ED Notes (Signed)
Pt's SpO2 dropped to 88 on RA while ambulating

## 2019-10-14 ENCOUNTER — Observation Stay (HOSPITAL_COMMUNITY): Payer: 59

## 2019-10-14 DIAGNOSIS — U071 COVID-19: Secondary | ICD-10-CM | POA: Diagnosis present

## 2019-10-14 DIAGNOSIS — I951 Orthostatic hypotension: Secondary | ICD-10-CM

## 2019-10-14 DIAGNOSIS — E860 Dehydration: Secondary | ICD-10-CM | POA: Diagnosis present

## 2019-10-14 DIAGNOSIS — Z807 Family history of other malignant neoplasms of lymphoid, hematopoietic and related tissues: Secondary | ICD-10-CM | POA: Diagnosis not present

## 2019-10-14 DIAGNOSIS — F319 Bipolar disorder, unspecified: Secondary | ICD-10-CM

## 2019-10-14 DIAGNOSIS — I9589 Other hypotension: Secondary | ICD-10-CM

## 2019-10-14 DIAGNOSIS — J9811 Atelectasis: Secondary | ICD-10-CM | POA: Diagnosis present

## 2019-10-14 DIAGNOSIS — Z79899 Other long term (current) drug therapy: Secondary | ICD-10-CM | POA: Diagnosis not present

## 2019-10-14 DIAGNOSIS — Z791 Long term (current) use of non-steroidal anti-inflammatories (NSAID): Secondary | ICD-10-CM | POA: Diagnosis not present

## 2019-10-14 DIAGNOSIS — I10 Essential (primary) hypertension: Secondary | ICD-10-CM | POA: Diagnosis present

## 2019-10-14 DIAGNOSIS — Z8249 Family history of ischemic heart disease and other diseases of the circulatory system: Secondary | ICD-10-CM | POA: Diagnosis not present

## 2019-10-14 DIAGNOSIS — F419 Anxiety disorder, unspecified: Secondary | ICD-10-CM | POA: Diagnosis present

## 2019-10-14 DIAGNOSIS — R001 Bradycardia, unspecified: Secondary | ICD-10-CM | POA: Diagnosis present

## 2019-10-14 DIAGNOSIS — R630 Anorexia: Secondary | ICD-10-CM | POA: Diagnosis present

## 2019-10-14 DIAGNOSIS — E876 Hypokalemia: Secondary | ICD-10-CM | POA: Diagnosis present

## 2019-10-14 DIAGNOSIS — R0902 Hypoxemia: Secondary | ICD-10-CM | POA: Diagnosis present

## 2019-10-14 DIAGNOSIS — E861 Hypovolemia: Secondary | ICD-10-CM | POA: Diagnosis not present

## 2019-10-14 DIAGNOSIS — R0602 Shortness of breath: Secondary | ICD-10-CM | POA: Diagnosis present

## 2019-10-14 DIAGNOSIS — I959 Hypotension, unspecified: Secondary | ICD-10-CM

## 2019-10-14 DIAGNOSIS — F3130 Bipolar disorder, current episode depressed, mild or moderate severity, unspecified: Secondary | ICD-10-CM | POA: Diagnosis present

## 2019-10-14 DIAGNOSIS — Z833 Family history of diabetes mellitus: Secondary | ICD-10-CM | POA: Diagnosis not present

## 2019-10-14 HISTORY — DX: Orthostatic hypotension: I95.1

## 2019-10-14 HISTORY — DX: COVID-19: U07.1

## 2019-10-14 HISTORY — DX: Hypotension, unspecified: I95.9

## 2019-10-14 LAB — URINALYSIS, ROUTINE W REFLEX MICROSCOPIC
Bilirubin Urine: NEGATIVE
Glucose, UA: NEGATIVE mg/dL
Hgb urine dipstick: NEGATIVE
Ketones, ur: 5 mg/dL — AB
Leukocytes,Ua: NEGATIVE
Nitrite: NEGATIVE
Protein, ur: NEGATIVE mg/dL
Specific Gravity, Urine: 1.017 (ref 1.005–1.030)
pH: 5 (ref 5.0–8.0)

## 2019-10-14 LAB — BASIC METABOLIC PANEL
Anion gap: 10 (ref 5–15)
BUN: 18 mg/dL (ref 6–20)
CO2: 30 mmol/L (ref 22–32)
Calcium: 8.5 mg/dL — ABNORMAL LOW (ref 8.9–10.3)
Chloride: 98 mmol/L (ref 98–111)
Creatinine, Ser: 0.76 mg/dL (ref 0.61–1.24)
GFR calc Af Amer: >60 mL/min (ref >60)
GFR calc non Af Amer: >60 mL/min (ref >60)
Glucose, Bld: 107 mg/dL — ABNORMAL HIGH (ref 70–99)
Potassium: 3.5 mmol/L (ref 3.5–5.1)
Sodium: 138 mmol/L (ref 135–145)

## 2019-10-14 LAB — PROCALCITONIN: Procalcitonin: <0.1 ng/mL

## 2019-10-14 LAB — C-REACTIVE PROTEIN: CRP: 3.5 mg/dL — ABNORMAL HIGH (ref <1.0)

## 2019-10-14 LAB — POC SARS CORONAVIRUS 2 AG -  ED: SARS Coronavirus 2 Ag: NEGATIVE

## 2019-10-14 LAB — LACTATE DEHYDROGENASE: LDH: 182 U/L (ref 98–192)

## 2019-10-14 LAB — D-DIMER, QUANTITATIVE: D-Dimer, Quant: 3.02 ug/mL-FEU — ABNORMAL HIGH (ref 0.00–0.50)

## 2019-10-14 LAB — SARS CORONAVIRUS 2 (TAT 6-24 HRS): SARS Coronavirus 2: POSITIVE — AB

## 2019-10-14 LAB — HIV ANTIBODY (ROUTINE TESTING W REFLEX): HIV Screen 4th Generation wRfx: NONREACTIVE

## 2019-10-14 LAB — FIBRINOGEN: Fibrinogen: 444 mg/dL (ref 210–475)

## 2019-10-14 LAB — ABO/RH: ABO/RH(D): A POS

## 2019-10-14 LAB — FERRITIN: Ferritin: 135 ng/mL (ref 24–336)

## 2019-10-14 LAB — TRIGLYCERIDES: Triglycerides: 117 mg/dL (ref <150)

## 2019-10-14 LAB — MAGNESIUM: Magnesium: 2.3 mg/dL (ref 1.7–2.4)

## 2019-10-14 MED ORDER — SODIUM CHLORIDE 0.45 % IV SOLN
INTRAVENOUS | Status: DC
  Administered 2019-10-14 – 2019-10-15 (×4): via INTRAVENOUS

## 2019-10-14 MED ORDER — CHLORTHALIDONE 25 MG PO TABS
25.0000 mg | ORAL_TABLET | Freq: Every day | ORAL | Status: DC
  Filled 2019-10-14: qty 1

## 2019-10-14 MED ORDER — ENOXAPARIN SODIUM 40 MG/0.4ML ~~LOC~~ SOLN
40.0000 mg | Freq: Every day | SUBCUTANEOUS | Status: DC
  Administered 2019-10-14 – 2019-10-15 (×3): 40 mg via SUBCUTANEOUS
  Filled 2019-10-14 (×3): qty 0.4

## 2019-10-14 MED ORDER — DULOXETINE HCL 60 MG PO CPEP
90.0000 mg | ORAL_CAPSULE | Freq: Every day | ORAL | Status: DC
  Administered 2019-10-14 – 2019-10-16 (×3): 90 mg via ORAL
  Filled 2019-10-14 (×2): qty 1
  Filled 2019-10-14: qty 3

## 2019-10-14 MED ORDER — ACETAMINOPHEN 325 MG PO TABS: 650.0000 mg | ORAL_TABLET | Freq: Four times a day (QID) | ORAL | Status: DC | PRN

## 2019-10-14 MED ORDER — ONDANSETRON HCL 4 MG PO TABS: 4.0000 mg | ORAL_TABLET | Freq: Four times a day (QID) | ORAL | Status: DC | PRN

## 2019-10-14 MED ORDER — DIVALPROEX SODIUM ER 500 MG PO TB24
500.0000 mg | ORAL_TABLET | Freq: Every day | ORAL | Status: DC
  Administered 2019-10-14 – 2019-10-16 (×3): 500 mg via ORAL
  Filled 2019-10-14 (×3): qty 1

## 2019-10-14 MED ORDER — IRBESARTAN 300 MG PO TABS
300.0000 mg | ORAL_TABLET | Freq: Every day | ORAL | Status: DC
  Filled 2019-10-14: qty 1

## 2019-10-14 MED ORDER — DEXAMETHASONE SODIUM PHOSPHATE 10 MG/ML IJ SOLN
6.0000 mg | INTRAMUSCULAR | Status: DC
  Administered 2019-10-14 – 2019-10-16 (×3): 6 mg via INTRAVENOUS
  Filled 2019-10-14 (×3): qty 1

## 2019-10-14 MED ORDER — ZOLPIDEM TARTRATE 5 MG PO TABS
5.0000 mg | ORAL_TABLET | Freq: Every evening | ORAL | Status: DC | PRN
  Administered 2019-10-14 – 2019-10-15 (×2): 5 mg via ORAL
  Filled 2019-10-14 (×2): qty 1

## 2019-10-14 MED ORDER — AZILSARTAN-CHLORTHALIDONE 40-25 MG PO TABS: 1.0000 | ORAL_TABLET | Freq: Every day | ORAL | Status: DC

## 2019-10-14 MED ORDER — POTASSIUM CHLORIDE CRYS ER 20 MEQ PO TBCR
40.0000 meq | EXTENDED_RELEASE_TABLET | Freq: Once | ORAL | Status: AC
  Administered 2019-10-14: 40 meq via ORAL
  Filled 2019-10-14: qty 2

## 2019-10-14 MED ORDER — ONDANSETRON HCL 4 MG/2ML IJ SOLN: 4.0000 mg | Freq: Four times a day (QID) | INTRAMUSCULAR | Status: DC | PRN

## 2019-10-14 MED ORDER — NALTREXONE HCL 50 MG PO TABS
50.0000 mg | ORAL_TABLET | Freq: Every day | ORAL | Status: DC
  Administered 2019-10-14 – 2019-10-16 (×3): 50 mg via ORAL
  Filled 2019-10-14 (×3): qty 1

## 2019-10-14 NOTE — ED Notes (Signed)
Pt provided with lunch tray. No complaints or concerns at this time. Pt provided with Diet Coke per request.

## 2019-10-14 NOTE — Progress Notes (Addendum)
PROGRESS NOTE    Chad Reynolds   [id number]  DOB: [birth year]  DOA: 10/13/2019 PCP: Deland Pretty, MD   Brief Narrative:  Chad Reynolds is a 58 y.o. male with medical history significant of for depression and hypertension.  He was exposed at work to [employer] !9 and was noted to test positive on 11/28.  He has subsequently noted a decrease in appetite fatigue malaise and therefore presented to the ED.  In the ED oxygen level was noted to drop below 88% when he ambulated and he was also noted to be hypotensive with systolic blood pressures in the 80s.  BP was as low as 88/52. Temperature was 101 and respiratory rate was in mid to high 20s.  A chest x-ray revealed bibasilar atelectasis but no focal consolidation  He was admitted this morning to the [facility].  Subjective: The patient admits to mild cough.  He also has shortness of breath and a feeling of lightheadedness when he ambulates.  He feels extreme fatigue and has no appetite.  There is no nausea vomiting or diarrhea today.    Assessment & Plan:   Principal Problem:   COVID-19 virus infection-with sepsis -Presenting with hypoxia with exertion, hypotension with BP of 88/52, temperature of 101 and tachypnea -He states he tested positive for on 11/28 has steadily become more short of breath and hypoxic-his wife has the infection as well -Due to hypoxia I have started IV steroids -Continue to follow closely for development of further hypoxia and pulmonary infiltrates -Continue to follow inflammatory markers  Active Problems:   Orthostatic hypotension-dehydration -BP 88/52 has improved to 122/87-orthostatic vitals have been rechecked and blood pressure is still dropping slightly when he stands from 120/82 to 114/78.  Heart rate increases from 58 to 74.  He continues to state that he feels lightheaded -This is likely related to poor oral intake in setting of diuretic (Chlorthalidone)- have held his Lexicographer  (Azilsartan-Chlorthalidone) today -He has an elevated BUN/creatinine ratio which further supports the fact that he is dehydrated -Urine output and oral intake remain poor today-I have encouraged him to drink more fluids -cont NS in the meantime  Anorexia -Related to above-mentioned COVID-19 infection-he is being started on steroids which may help with his appetite  Hypokalemia -Suspect this is related to his chlorthalidone which I have held -I do not see where it has been replaced yet therefore I will go ahead and order replacement and will also check a magnesium level  Mild bradycardia -Heart rate is recorded to be in the high 50s -The patient is not on any rate controlling agents and thus this is likely physiologic-follow   Bipolar 1 disorder, depressed (HCC) Resume Depakote, Cymbalta  History of alcohol use -The patient states that he is taking naltrexone for this and has not been drinking at all lately-nonetheless we will continue to see if he has any withdrawal symptoms and I will place him on a CIWA scale if he is to develop any  Time spent in minutes: 35 minutes DVT prophylaxis: Enoxaparin Code Status: Full code Family Communication:  Disposition Plan: Home when clinically improved-continue IV fluids and continue monitoring progression of Covid infection Consultants:   None Procedures:   None Antimicrobials:  Anti-infectives (From admission, onward)   None       Objective: Vitals:   10/14/19 0500 10/14/19 0530 10/14/19 0700 10/14/19 0730  BP: (!) 104/58 114/70 120/87 115/81  Pulse: (!) 57 (!) 52 (!) 55 (!) 52  Resp: Marland Kitchen)  25 (!) 26    Temp:      TempSrc:      SpO2: 93% 94% 94% 97%  Weight:      Height:        Intake/Output Summary (Last 24 hours) at 10/14/2019 0816 Last data filed at 10/13/2019 2353 Gross per 24 hour  Intake 1000 ml  Output -  Net 1000 ml   Filed Weights   10/13/19 1544  Weight: 96.2 kg    Examination: General exam: Appears  comfortable  HEENT: PERRLA, oral mucosa dry, no sclera icterus or thrush Respiratory system: Clear to auscultation. Respiratory effort normal. Cardiovascular system: S1 & S2 heard, RRR.   Gastrointestinal system: Abdomen soft, non-tender, nondistended. Normal bowel sounds. Central nervous system: Alert and oriented. No focal neurological deficits. Extremities: No cyanosis, clubbing or edema Skin: No rashes or ulcers Psychiatry:  Mood & affect appropriate.     Data Reviewed: I have personally reviewed following labs and imaging studies  CBC: Recent Labs  Lab 10/13/19 1623  WBC 7.1  NEUTROABS 4.0  HGB 14.0  HCT 37.8*  MCV 92.2  PLT [id number]   Basic Metabolic Panel: Recent Labs  Lab 10/13/19 1623  NA 138  K 3.3*  CL 93*  CO2 31  GLUCOSE 112*  BUN 23*  CREATININE 1.11  CALCIUM 9.1   GFR: Estimated Creatinine Clearance: 84 mL/min (by C-G formula based on SCr of 1.11 mg/dL). Liver Function Tests: Recent Labs  Lab 10/13/19 1623  AST 28  ALT 31  ALKPHOS 37*  BILITOT 1.1  PROT 7.4  ALBUMIN 4.0   No results for input(s): LIPASE, AMYLASE in the last 168 hours. No results for input(s): AMMONIA in the last 168 hours. Coagulation Profile: Recent Labs  Lab 10/13/19 1623  INR 0.9   Cardiac Enzymes: No results for input(s): CKTOTAL, CKMB, CKMBINDEX, TROPONINI in the last 168 hours. BNP (last 3 results) No results for input(s): PROBNP in the last 8760 hours. HbA1C: No results for input(s): HGBA1C in the last 72 hours. CBG: No results for input(s): GLUCAP in the last 168 hours. Lipid Profile: Recent Labs    10/13/19 2355  TRIG 117   Thyroid Function Tests: No results for input(s): TSH, T4TOTAL, FREET4, T3FREE, THYROIDAB in the last 72 hours. Anemia Panel: Recent Labs    10/13/19 2355  FERRITIN 135   Urine analysis:    Component Value Date/Time   COLORURINE AMBER (A) 10/14/2019 0330   APPEARANCEUR CLEAR 10/14/2019 0330   LABSPEC 1.017 10/14/2019 0330    PHURINE 5.0 10/14/2019 0330   GLUCOSEU NEGATIVE 10/14/2019 0330   HGBUR NEGATIVE 10/14/2019 0330   BILIRUBINUR NEGATIVE 10/14/2019 0330   KETONESUR 5 (A) 10/14/2019 0330   PROTEINUR NEGATIVE 10/14/2019 0330   NITRITE NEGATIVE 10/14/2019 0330   LEUKOCYTESUR NEGATIVE 10/14/2019 0330   Sepsis Labs: @LABRCNTIP (procalcitonin:4,lacticidven:4) )No results found for this or any previous visit (from the past 240 hour(s)).       Radiology Studies: Portable Chest 1 View  Result Date: 10/14/2019 CLINICAL DATA:  Shortness of breath.  COVID-19 positive. EXAM: PORTABLE CHEST 1 VIEW COMPARISON:  10/13/2019 FINDINGS: The cardiac silhouette, mediastinal and hilar contours are within normal limits and stable. Moderate eventration of the right hemidiaphragm. Streaky bibasilar infiltrates or atelectasis. No pleural effusions. Remote healed right rib fractures. IMPRESSION: Persistent streaky bibasilar atelectasis or infiltrates but no effusions or edema. Electronically Signed   By: Marijo Sanes M.D.   On: 10/14/2019 05:49   Dg Chest Portable 1 View  Result Date: 10/13/2019 CLINICAL DATA:  58 year old male with sepsis. EXAM: PORTABLE CHEST 1 VIEW COMPARISON:  Chest radiograph dated 09/22/2011. FINDINGS: There is shallow inspiration with bibasilar linear and platelike atelectasis. No focal consolidation, pleural effusion, or pneumothorax. The cardiac silhouette is within normal limits. Old healed right rib fractures. No acute osseous pathology. IMPRESSION: Shallow inspiration with bibasilar atelectasis. No focal consolidation. Electronically Signed   By: Anner Crete M.D.   On: 10/13/2019 16:59      Scheduled Meds: . divalproex  500 mg Oral Daily  . DULoxetine  90 mg Oral Daily  . enoxaparin (LOVENOX) injection  40 mg Subcutaneous QHS  . irbesartan  300 mg Oral Daily  . naltrexone  50 mg Oral Daily   Continuous Infusions: . sodium chloride 75 mL/hr at 10/14/19 0108     LOS: 0 days       Debbe Odea, MD [facility] Pager: [email or web address] Password [email or web address] 10/14/2019, 8:16 AM

## 2019-10-14 NOTE — ED Notes (Signed)
Wife would like to be updated when pt has a room assigned, or with any changes

## 2019-10-14 NOTE — H&P (Signed)
History and Physical    Chad Reynolds [id number] DOB: Jan 29, 1961 DOA: 10/13/2019  PCP: Deland Pretty, MD (Confirm with patient/family/NH records and if not entered, this has to be entered at [facility] point of entry) Patient coming from: Patient is coming from home  I have personally briefly reviewed patient's old medical records in [place]  Chief Complaint: Increasing fatigue with myalgias and a loss of appetite  HPI: Chad Reynolds is a 58 y.o. male with medical history significant of for disease with depression followed by Dr. Jake Samples, hypertension treated medically.  He was exposed at work to [employer].  Was tested at a [facility] facility on November 28 and was positive.  Since testing positive he has been quarantining at home.  Patient reports that he has had loss of appetite decreased p.o. intake of both food and fluids, severe malaise and fatigue. Wife reports that he has be hypoxemic with exertion at thome. He presents to the emergency department at [facility] due to his symptoms.  (For level 3, the HPI must include 4+ descriptors: Location, Quality, Severity, Duration, Timing, Context, modifying factors, associated signs/symptoms and/or status of 3+ chronic problems.)  (Please avoid self-populating past medical history here) (The initial 2-3 lines should be focused and good to copy and paste in the HPI section of the daily progress note).  ED Course: In the emergency department the patient was found to have mild hypotension with blood pressure dropping into the [id number] systolic.  O2 saturation was normal on room air at rest but dropped to 88% with ambulation.  Because of his relative hypotension and mild desaturation activity he is referred to [facility] for admission.  Review of Systems: As per HPI otherwise 10 point review of systems negative.    Past Medical History:  Diagnosis Date  . Anxiety   . Hypertension     Past Surgical History:  Procedure Laterality Date  . CATARACT  EXTRACTION Bilateral   Social history -patient is married.  He has twins, 1 boy 1 girl, age 67.  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  His marriage is in good health.   reports that he has never smoked. He has never used smokeless tobacco. He reports current alcohol use. He reports that he does not use drugs.  No Known Allergies  Family History  Problem Relation Age of Onset  . Tremor Mother   . Diabetes Mother   . Diabetes Father   . Atrial fibrillation Father   . Heart disease Father   . Lymphoma Father   . Tremor Maternal Grandmother    Unacceptable: Noncontributory, unremarkable, or negative. Acceptable: Family history reviewed and not pertinent (If you reviewed it)  Prior to Admission medications   Medication Sig Start Date End Date Taking? Authorizing Provider  Azilsartan Medoxomil (EDARBI) 40 MG TABS Take 40 mg by mouth daily.   Yes [provider]  clobetasol cream (TEMOVATE) [id number] % Apply 1 application topically 2 (two) times daily as needed (dry skin).  08/17/19  Yes [provider]  divalproex (DEPAKOTE ER) 500 MG 24 hr tablet Take 500 mg by mouth daily.  09/27/19  Yes [provider]  DULoxetine (CYMBALTA) 30 MG capsule Take 90 mg by mouth daily.  09/27/19  Yes [provider]  EDARBYCLOR 40-25 MG TABS Take 1 tablet by mouth daily. 09/27/19  Yes [provider]  ibuprofen (ADVIL) 200 MG tablet Take 400 mg by mouth every 6 (six) hours as needed for  moderate pain.   Yes [provider]  naltrexone (DEPADE) 50 MG tablet Take 1 tablet (50 mg total) by mouth daily. 05/06/19  Yes Cottle, Billey Co., MD  acamprosate (CAMPRAL) 333 MG tablet Take 2 tablets (666 mg total) by mouth 3 (three) times daily. Patient not taking: Reported on 10/13/2019 05/06/19   Purnell Shoemaker., MD  divalproex (DEPAKOTE) 500 MG DR tablet Take 3 tablets (1,500 mg total) by mouth at bedtime. Patient not taking: Reported on 10/13/2019  05/06/19   Purnell Shoemaker., MD  DULoxetine HCl 30 MG CSDR Take 90 mg by mouth daily. Patient not taking: Reported on 10/13/2019 05/06/19   Purnell Shoemaker., MD    Physical Exam: Vitals:   10/13/19 2141 10/13/19 2200 10/13/19 2230 10/13/19 2342  BP: 96/64 93/62 (!) 96/58 (!) 97/56  Pulse: (!) 57 62 60 62  Resp:  (!) 25 (!) 26 (!) 25  Temp:      TempSrc:      SpO2: 95% 93% 91% 96%  Weight:      Height:        Constitutional: NAD, calm, comfortable Vitals:   10/13/19 2141 10/13/19 2200 10/13/19 2230 10/13/19 2342  BP: 96/64 93/62 (!) 96/58 (!) 97/56  Pulse: (!) 57 62 60 62  Resp:  (!) 25 (!) 26 (!) 25  Temp:      TempSrc:      SpO2: 95% 93% 91% 96%  Weight:      Height:       General appearance: Well-nourished well-developed overweight man in no distress  eyes: PERRL, lids and conjunctivae normal ENMT: Mucous membranes are moist. Posterior pharynx clear of any exudate or lesions.Normal dentition.  Neck: normal, supple, no masses, no thyromegaly Respiratory: clear to auscultation bilaterally, no wheezing, no crackles. Normal respiratory effort. No accessory muscle use.  Cardiovascular: Regular rate and rhythm, no murmurs / rubs / gallops. No extremity edema. 2+ pedal pulses. No carotid bruits.  Abdomen: Obese, no tenderness, no masses palpated. No hepatosplenomegaly. Bowel sounds positive.  Musculoskeletal: no clubbing / cyanosis. No joint deformity upper and lower extremities. Good ROM, no contractures. Normal muscle tone.  Skin: no rashes, lesions, ulcers. No induration Neurologic: CN 2-12 grossly intact. Sensation intact, DTR normal. Strength 5/5 in all 4.  Psychiatric: Normal judgment and insight. Alert and oriented x 3. Normal mood.     Labs on Admission: I have personally reviewed following labs and imaging studies  CBC: Recent Labs  Lab 10/13/19 1623  WBC 7.1  NEUTROABS 4.0  HGB 14.0  HCT 37.8*  MCV 92.2  PLT [id number]   Basic Metabolic Panel: Recent Labs   Lab 10/13/19 1623  NA 138  K 3.3*  CL 93*  CO2 31  GLUCOSE 112*  BUN 23*  CREATININE 1.11  CALCIUM 9.1   GFR: Estimated Creatinine Clearance: 84 mL/min (by C-G formula based on SCr of 1.11 mg/dL). Liver Function Tests: Recent Labs  Lab 10/13/19 1623  AST 28  ALT 31  ALKPHOS 37*  BILITOT 1.1  PROT 7.4  ALBUMIN 4.0   No results for input(s): LIPASE, AMYLASE in the last 168 hours. No results for input(s): AMMONIA in the last 168 hours. Coagulation Profile: Recent Labs  Lab 10/13/19 1623  INR 0.9   Cardiac Enzymes: No results for input(s): CKTOTAL, CKMB, CKMBINDEX, TROPONINI in the last 168 hours. BNP (last 3 results) No results for input(s): PROBNP in the last 8760 hours. HbA1C: No results for input(s):  HGBA1C in the last 72 hours. CBG: No results for input(s): GLUCAP in the last 168 hours. Lipid Profile: No results for input(s): CHOL, HDL, LDLCALC, TRIG, CHOLHDL, LDLDIRECT in the last 72 hours. Thyroid Function Tests: No results for input(s): TSH, T4TOTAL, FREET4, T3FREE, THYROIDAB in the last 72 hours. Anemia Panel: No results for input(s): VITAMINB12, FOLATE, FERRITIN, TIBC, IRON, RETICCTPCT in the last 72 hours. Urine analysis: No results found for: COLORURINE, APPEARANCEUR, LABSPEC, PHURINE, GLUCOSEU, HGBUR, BILIRUBINUR, KETONESUR, PROTEINUR, UROBILINOGEN, NITRITE, LEUKOCYTESUR  Radiological Exams on Admission: Dg Chest Portable 1 View  Result Date: 10/13/2019 CLINICAL DATA:  58 year old male with sepsis. EXAM: PORTABLE CHEST 1 VIEW COMPARISON:  Chest radiograph dated 09/22/2011. FINDINGS: There is shallow inspiration with bibasilar linear and platelike atelectasis. No focal consolidation, pleural effusion, or pneumothorax. The cardiac silhouette is within normal limits. Old healed right rib fractures. No acute osseous pathology. IMPRESSION: Shallow inspiration with bibasilar atelectasis. No focal consolidation. Electronically Signed   By: Anner Crete M.D.    On: 10/13/2019 16:59    EKG: Independently reviewed.  EKG reveals a sinus rhythm.  Is for old anteroseptal injury.  No acute changes are noted.  Assessment/Plan Active Problems:   COVID-19 virus infection   Hypotension   Bipolar 1 disorder, depressed ([place])  (please populate well all problems here in Problem List. (For example, if patient is on BP meds at home and you resume or decide to hold them, it is a problem that needs to be her. Same for CAD, COPD, HLD and so on)   1.  COVID-19 virus infection -patient is Covid positive.  He has minimal symptoms.  No x-ray changes.  Mild oxygen desaturation with activity but normal at rest. Not a candidate for steroids unless inflammatory markers return as elevated, nor antiviral or biologics. Plan Observation  Follow-up chest x-ray in 24 hours  No indication for steroids or antivirals at this time.  IS every hour  2.  Hypotension -suspect the patient is volume depleted secondary to Covid related loss of appetite and decreased p.o. intake. Plan IV hydration at 75 cc an hour of normal saline.  Orthostatic vital signs every 12 hours  Hold antihypertensive medication for systolic blood pressure less than 110  3.  Psychiatric -patient is very stable.  We will continue his home medications  DVT prophylaxis: Lovenox (Lovenox/Heparin/SCD's/anticoagulated/None (if comfort care) Code Status: Full code (Full/Partial (specify details) Family Communication: spoke with spouse, who is covid +, explained treatment plan including possibility of transfer to another campus if there are no beds. (Specify name, relationship. Do not write "discussed with patient". Specify tel # if discussed over the phone) Disposition Plan: home 24-48 hrs (specify when and where you expect patient to be discharged) Consults called: none (with names) Admission status: obs (inpatient / obs / tele / medical floor / SDU)   Adella Hare MD [facility] Pager [phone]   If 7PM-7AM, please contact night-coverage [email or web address] Password [email or web address]  10/14/2019, 12:38 AM

## 2019-10-14 NOTE — ED Notes (Signed)
Pt provided with breakfast tray. Pt has no complaints or concerns at this time. Pt updated on plan of care. Pt repositioned in bed for comfort. Pts cell phone plugged into power supply. Pts room phone at bedside within reach. Pts call bell at bedside, within reach. Pt has urinal at bedside, within reach.

## 2019-10-16 LAB — RENAL FUNCTION PANEL
Albumin: 3.5 g/dL (ref 3.5–5.0)
Anion gap: 11 (ref 5–15)
BUN: 13 mg/dL (ref 6–20)
CO2: 31 mmol/L (ref 22–32)
Calcium: 8.6 mg/dL — ABNORMAL LOW (ref 8.9–10.3)
Chloride: 99 mmol/L (ref 98–111)
Creatinine, Ser: 0.91 mg/dL (ref 0.61–1.24)
GFR calc Af Amer: >60 mL/min (ref >60)
GFR calc non Af Amer: >60 mL/min (ref >60)
Glucose, Bld: 119 mg/dL — ABNORMAL HIGH (ref 70–99)
Phosphorus: 2.9 mg/dL (ref 2.5–4.6)
Potassium: 3.4 mmol/L — ABNORMAL LOW (ref 3.5–5.1)
Sodium: 141 mmol/L (ref 135–145)

## 2019-10-16 LAB — MAGNESIUM: Magnesium: 2.4 mg/dL (ref 1.7–2.4)

## 2019-10-16 MED ORDER — POTASSIUM CHLORIDE CRYS ER 20 MEQ PO TBCR
40.0000 meq | EXTENDED_RELEASE_TABLET | Freq: Once | ORAL | Status: AC
  Administered 2019-10-16: 40 meq via ORAL
  Filled 2019-10-16: qty 2

## 2019-10-16 MED ORDER — DEXAMETHASONE 6 MG PO TABS: 6.0000 mg | ORAL_TABLET | Freq: Every day | ORAL | 0 refills | Status: DC

## 2019-10-16 NOTE — Progress Notes (Signed)
AVS given to patient and explained at the bedside. Medications and follow up appointments have been explained with pt verbalizing understanding.  

## 2019-10-16 NOTE — Progress Notes (Signed)
PROGRESS NOTE    Chad Reynolds   [id number]  DOB: 1961/11/08  DOA: 10/13/2019 PCP: Deland Pretty, MD   Brief Narrative:  Chad Reynolds is a 58 y.o. male with medical history significant of for depression and hypertension.  He was exposed at work to [employer] and was noted to test positive on 11/28.  He has subsequently noted a decrease in appetite fatigue malaise and therefore presented to the ED.  In the ED oxygen level was noted to drop below 88% when he ambulated and he was also noted to be hypotensive with systolic blood pressures in the 80s.  BP was as low as 88/52. Temperature was 101 and respiratory rate was in mid to high 20s.  A chest x-ray revealed bibasilar atelectasis but no focal consolidation  He was admitted this [facility] to [facility].  10/15/2019: Patient seems to be improving.  We will discontinue IV fluids.  Will assess need for home oxygen.  Likely discharge home tomorrow.  Subjective: No new complaints. No shortness of breath. No fever or chills. Blood pressure is better controlled.  Assessment & Plan:   Principal Problem:   COVID-19 virus infection-with sepsis -Presenting with hypoxia with exertion, hypotension with BP of 88/52, temperature of 101 and tachypnea -He states he tested positive for on 11/28 has steadily become more short of breath and hypoxic-his wife has the infection as well -Due to hypoxia I have started IV steroids -Continue to follow closely for development of further hypoxia and pulmonary infiltrates -Continue to follow inflammatory markers 10/15/2019: Discontinue IV fluids.  Assess need for home oxygen.  Possible discharge tomorrow.  Active Problems:   Orthostatic hypotension-dehydration -BP 88/52 has improved to 122/87-orthostatic vitals have been rechecked and blood pressure is still dropping slightly when he stands from 120/82 to 114/78.  Heart rate increases from 58 to 74.  He continues to state that he feels  lightheaded -This is likely related to poor oral intake in setting of diuretic (Chlorthalidone)- have held his Edarbyclor (Azilsartan-Chlorthalidone) today -He has an elevated BUN/creatinine ratio which further supports the fact that he is dehydrated -Urine output and oral intake remain poor today-I have encouraged him to drink more fluids -cont NS in the meantime 10/15/2019: Blood pressure is better controlled today.  Will discontinue IV fluids.  Anorexia -Related to above-mentioned COVID-19 infection-he is being started on steroids which may help with his appetite 10/15/2019: Appetite seems to be improving.  Hypokalemia -Suspect this is related to his chlorthalidone which I have held -I do not see where it has been replaced yet therefore I will go ahead and order replacement and will also check a magnesium level 10/15/2019: Repeat renal function in the morning.  Mild bradycardia -Heart rate is recorded to be in the high 50s -The patient is not on any rate controlling agents and thus this is likely physiologic-follow   Bipolar 1 disorder, depressed (HCC) Resume Depakote, Cymbalta  History of alcohol use -The patient states that he is taking naltrexone for this and has not been drinking at all lately-nonetheless we will continue to see if he has any withdrawal symptoms and I will place him on a CIWA scale if he is to develop any  Time spent in minutes: 25 minutes DVT prophylaxis: Enoxaparin Code Status: Full code Family Communication:  Disposition Plan: Likely home tomorrow  Consultants:   None Procedures:   None Antimicrobials:  Anti-infectives (From admission, onward)   None       Objective:  Vitals:   10/15/19 1526 10/15/19 2311 10/15/19 2316 10/15/19 2321  BP:  (!) 113/59 121/89 104/84  Pulse:  (!) 49 72 92  Resp:  16    Temp:  97.8 F (36.6 C)    TempSrc:  Oral    SpO2: 98% 92% 94% 95%  Weight:      Height:        Intake/Output Summary (Last 24 hours) at  10/16/2019 0155 Last data filed at 10/15/2019 2350 Gross per 24 hour  Intake 1510.22 ml  Output -  Net 1510.22 ml   Filed Weights   10/13/19 1544 10/14/19 1230  Weight: 96.2 kg 97.5 kg    Examination: General exam: Appears comfortable  HEENT: PERRLA Respiratory system: Respiratory effort normal. Cardiovascular system: S1 & S2 heard Gastrointestinal system: Abdomen soft, non-tender, nondistended. Normal bowel sounds. Central nervous system: Alert and oriented.  Patient moves all extremities.   Extremities: No leg edema.  Data Reviewed: I have personally reviewed following labs and imaging studies  CBC: Recent Labs  Lab 10/13/19 1623  WBC 7.1  NEUTROABS 4.0  HGB 14.0  HCT 37.8*  MCV 92.2  PLT Q000111Q   Basic Metabolic Panel: Recent Labs  Lab 10/13/19 1623 10/14/19 1050  NA 138 138  K 3.3* 3.5  CL 93* 98  CO2 31 30  GLUCOSE 112* 107*  BUN 23* 18  CREATININE 1.11 0.76  CALCIUM 9.1 8.5*  MG  --  2.3   GFR: Estimated Creatinine Clearance: 117.3 mL/min (by C-G formula based on SCr of 0.76 mg/dL). Liver Function Tests: Recent Labs  Lab 10/13/19 1623  AST 28  ALT 31  ALKPHOS 37*  BILITOT 1.1  PROT 7.4  ALBUMIN 4.0   No results for input(s): LIPASE, AMYLASE in the last 168 hours. No results for input(s): AMMONIA in the last 168 hours. Coagulation Profile: Recent Labs  Lab 10/13/19 1623  INR 0.9   Cardiac Enzymes: No results for input(s): CKTOTAL, CKMB, CKMBINDEX, TROPONINI in the last 168 hours. BNP (last 3 results) No results for input(s): PROBNP in the last 8760 hours. HbA1C: No results for input(s): HGBA1C in the last 72 hours. CBG: No results for input(s): GLUCAP in the last 168 hours. Lipid Profile: Recent Labs    10/13/19 2355  TRIG 117   Thyroid Function Tests: No results for input(s): TSH, T4TOTAL, FREET4, T3FREE, THYROIDAB in the last 72 hours. Anemia Panel: Recent Labs    10/13/19 2355  FERRITIN 135   Urine analysis:    Component  Value Date/Time   COLORURINE AMBER (A) 10/14/2019 0330   APPEARANCEUR CLEAR 10/14/2019 0330   LABSPEC 1.017 10/14/2019 0330   PHURINE 5.0 10/14/2019 0330   GLUCOSEU NEGATIVE 10/14/2019 0330   HGBUR NEGATIVE 10/14/2019 0330   BILIRUBINUR NEGATIVE 10/14/2019 0330   KETONESUR 5 (A) 10/14/2019 0330   PROTEINUR NEGATIVE 10/14/2019 0330   NITRITE NEGATIVE 10/14/2019 0330   LEUKOCYTESUR NEGATIVE 10/14/2019 0330   Sepsis Labs: @LABRCNTIP (procalcitonin:4,lacticidven:4) ) Recent Results (from the past 240 hour(s))  Culture, blood (Routine x 2)     Status: None (Preliminary result)   Collection Time: 10/13/19  4:06 PM   Specimen: BLOOD  Result Value Ref Range Status   Specimen Description   Final    BLOOD LEFT ANTECUBITAL Performed at [facility], [place] [address] [place], [place] [address]    Special Requests   Final    BOTTLES DRAWN AEROBIC AND ANAEROBIC Blood Culture adequate volume Performed at [facility],  [place] [address], [place], [place] [address]    Culture   Final    NO GROWTH 2 DAYS Performed at [place] [address], [place], [place] [address]    Report Status PENDING  Incomplete  Culture, blood (Routine x 2)     Status: None (Preliminary result)   Collection Time: 10/13/19  6:44 PM   Specimen: BLOOD LEFT FOREARM  Result Value Ref Range Status   Specimen Description   Final    BLOOD LEFT FOREARM Performed at [facility], [place] [address], [place], [place] [address]    Special Requests   Final    BOTTLES DRAWN AEROBIC AND ANAEROBIC Blood Culture adequate volume Performed at [place] [address] [place], [place] [address]    Culture   Final    NO GROWTH 2 DAYS Performed at [facility] [address], [place], [place] [address]    Report Status PENDING  Incomplete  SARS CORONAVIRUS 2 (TAT 6-24 HRS) Nasopharyngeal Nasopharyngeal Swab     Status: Abnormal   Collection Time:  10/14/19  1:17 AM   Specimen: Nasopharyngeal Swab  Result Value Ref Range Status   SARS Coronavirus 2 POSITIVE (A) NEGATIVE Final    Comment: RESULT CALLED TO, READ BACK BY AND VERIFIED WITH: Charlton Haws, [facility] ED CHG RN AT 1005 ON 10/14/19 BY C. JESSUP, MT. (NOTE) SARS-CoV-2 target nucleic acids are DETECTED. The SARS-CoV-2 RNA is generally detectable in upper and lower respiratory specimens during the acute phase of infection. Positive results are indicative of the presence of SARS-CoV-2 RNA. Clinical correlation with patient history and other diagnostic information is  necessary to determine patient infection status. Positive results do not rule out bacterial infection or co-infection with other viruses.  The expected result is Negative. Fact Sheet for Patients: SugarRoll.be Fact Sheet for Healthcare Providers: https://www.woods-mathews.com/ This test is not yet approved or cleared by the [place] FDA and  has been authorized for detection and/or diagnosis of SARS-CoV-2 by FDA under an Emergency Use Authorization (EUA). This EUA will remain  in effect (meaning this t est can be used) for the duration of the COVID-19 declaration under Section 564(b)(1) of the Act, 21 U.S.C. section 360bbb-3(b)(1), unless the authorization is terminated or revoked sooner. Performed at [facility], [place] [address], [place], [place] [address]          Radiology Studies: Portable Chest 1 View  Result Date: 10/14/2019 CLINICAL DATA:  Shortness of breath.  COVID-19 positive. EXAM: PORTABLE CHEST 1 VIEW COMPARISON:  10/13/2019 FINDINGS: The cardiac silhouette, mediastinal and hilar contours are within normal limits and stable. Moderate eventration of the right hemidiaphragm. Streaky bibasilar infiltrates or atelectasis. No pleural effusions. Remote healed right rib fractures. IMPRESSION: Persistent streaky bibasilar atelectasis or infiltrates but no  effusions or edema. Electronically Signed   By: Marijo Sanes M.D.   On: 10/14/2019 05:49      Scheduled Meds: . dexamethasone (DECADRON) injection  6 mg Intravenous Q24H  . divalproex  500 mg Oral Daily  . DULoxetine  90 mg Oral Daily  . enoxaparin (LOVENOX) injection  40 mg Subcutaneous QHS  . naltrexone  50 mg Oral Daily   Continuous Infusions:    LOS: 2 days      Bonnell Public, MD [facility] Pager: [email or web address] Password [email or web address] 10/16/2019, 1:55 AM

## 2019-10-16 NOTE — Discharge Summary (Signed)
Physician Discharge Summary  Patient ID: Chad Reynolds MRN: [id number] DOB/AGE: 1961-02-11 58 y.o.  Admit date: 10/13/2019 Discharge date: 10/16/2019  Admission Diagnoses:  Discharge Diagnoses:  Principal Problem:   COVID-19 virus infection Active Problems:   Bipolar 1 disorder, depressed ([place])   Hypotension   Orthostatic hypotension  Discharged Condition: stable  Hospital Course: Patient is a 58 year old male with past medical history significant for hypertension.  Apparently, patient was exposed to Covid 19 at work, and tested positive for COVID-19 on 10/08/2019.  Patient presented to the hospital with poor appetite, fatigue, malaise and O2 sat that dropped to below 88% with ambulation.  Patient was also hypotensive, with systolic blood pressure in the 80s.  Temperature of 101 F was documented on presentation.  Chest x-ray revealed bibasilar atelectasis, but no focal consolidation.  Patient was admitted for further assessment and management of COVID-19 infection.  Patient was managed with IV steroids.  Patient was also managed supportively.  Inflammatory markers were checked daily.  Patient has improved significantly, and is eager to be discharged back home.  COVID-19 virus infection-with sepsis -Patient presented with hypoxia with exertion, hypotension with BP of 88/52, temperature of 101 and tachypnea -Patient stated that he tested positive for Covid on 10/08/2019.  Patient has gradually developed symptoms, including shortness of breath/hypoxia.   -Patient was admitted and managed with IV steroids.  Supportive care was started.  Inflammatory markers were checked daily.  Patient has improved significantly.  Patient is eager to be discharged back home.  Orthostatic hypotension:  -Patient was volume depleted on presentation. -Blood pressure on presentation was 88/52 mmHg.   -We will cautious hydration, and discontinuation of azilsartan/chlorthalidone, patient's blood pressure improved.    -PCP to monitor blood pressure closely and manage expectantly.  Anorexia -Likely related to COVID-19 infection. -Appetite improved during the hospital stay.  Hypokalemia -Likely secondary to chlorthalidone. -Potassium was monitored and repleted during the hospital stay.  Mild bradycardia -Heart rate is recorded to be in the high 50s -The patient is not on any rate controlling agents and thus this is likely physiologic-follow  Bipolar 1 disorder, depressed (HCC) Resumed Depakote, Cymbalta  History of alcohol use: -Continue to monitor. -Patient claims that he has been on naltrexone. -Will defer further management to PCP.  Consults: None  Significant Diagnostic Studies:  -Positive COVID-19  Discharge Exam: Blood pressure 140/85, pulse (!) 47, temperature 98.1 F (36.7 C), temperature source Oral, resp. rate 20, height 5\' 9"  (1.753 m), weight 97.5 kg, SpO2 97 %.  Disposition: Discharge disposition: 01-Home or Self Care  Discharge Instructions    Diet - low sodium heart healthy   Complete by: As directed    Increase activity slowly   Complete by: As directed      Allergies as of 10/16/2019   No Known Allergies     Medication List    STOP taking these medications   acamprosate 333 MG tablet Commonly known as: CAMPRAL   clobetasol cream 0.05 % Commonly known as: TEMOVATE   Edarbi 40 MG Tabs Generic drug: Azilsartan Medoxomil   Edarbyclor 40-25 MG Tabs Generic drug: Azilsartan-Chlorthalidone   ibuprofen 200 MG tablet Commonly known as: ADVIL     TAKE these medications   dexamethasone 6 MG tablet Commonly known as: Decadron Take 1 tablet (6 mg total) by mouth daily.   divalproex 500 MG 24 hr tablet Commonly known as: DEPAKOTE ER Take 500 mg by mouth daily. What changed: Another medication with the same name was removed.  Continue taking this medication, and follow the directions you see here.   DULoxetine 30 MG capsule Commonly known as:  CYMBALTA Take 90 mg by mouth daily. What changed: Another medication with the same name was removed. Continue taking this medication, and follow the directions you see here.   naltrexone 50 MG tablet Commonly known as: DEPADE Take 1 tablet (50 mg total) by mouth daily.        SignedBonnell Public 10/16/2019, 2:01 PM

## 2019-10-18 LAB — CULTURE, BLOOD (ROUTINE X 2)
Culture: NO GROWTH
Special Requests: ADEQUATE

## 2019-10-21 LAB — CULTURE, BLOOD (ROUTINE X 2): Special Requests: ADEQUATE

## 2019-10-27 ENCOUNTER — Ambulatory Visit (INDEPENDENT_AMBULATORY_CARE_PROVIDER_SITE_OTHER): Payer: 59 | Admitting: Psychiatry

## 2019-10-27 ENCOUNTER — Encounter: Payer: Self-pay | Admitting: Psychiatry

## 2019-10-27 DIAGNOSIS — F1021 Alcohol dependence, in remission: Secondary | ICD-10-CM

## 2019-10-27 DIAGNOSIS — F3341 Major depressive disorder, recurrent, in partial remission: Secondary | ICD-10-CM

## 2019-10-27 MED ORDER — DIVALPROEX SODIUM ER 500 MG PO TB24: 1500.0000 mg | ORAL_TABLET | Freq: Every day | ORAL | 1 refills | Status: DC

## 2019-10-27 MED ORDER — DULOXETINE HCL 30 MG PO CPEP: 90.0000 mg | ORAL_CAPSULE | Freq: Every day | ORAL | 1 refills | Status: DC

## 2019-10-27 MED ORDER — NALTREXONE HCL 50 MG PO TABS: 50.0000 mg | ORAL_TABLET | Freq: Every day | ORAL | 3 refills | Status: DC

## 2019-10-27 NOTE — Progress Notes (Signed)
Chad Reynolds [id number] 15-Aug-1961 58 y.o.  Subjective:   Patient ID:  Chad Reynolds is a 58 y.o. (DOB 08-10-1961) male.  Chief Complaint:  Chief Complaint  Patient presents with  . Follow-up  . Depression  . Stress    HPI Chad Reynolds presents to the office today for follow-up of alcohol dependence and major depression mixed versus irritable bipolar disorder.  Last visit was June 2020.  he was doing well.  No meds were changed.  He was taking duloxetine 90 mg, Depakote ER 1500 mg, naltrexone, and Campral.  He and wife had Covid a couple weeks ago.  He was hosp and she was also for 4 days.  Has felt weak and slow recovery.   He's off the Campral without the problems.    Ok overall with everything going on.  Work affected by [employer].  Stress with father falling and needs help.  Overall handling stress ok.  Satisfied with meds without need for changes.  Patient reports stable mood and denies depressed or irritable moods.  Patient denies any recent difficulty with anxiety.  Patient has occ difficulty with sleep initiation or maintenance. Denies appetite disturbance.  Patient reports that energy and motivation have been good.  Patient denies any difficulty with concentration.  Patient denies any suicidal ideation.  Sometimes crazy dreams.  Occ beer without excess now.  Minimal alcohol.  No longer hangs out with friends who drink to excess.  Past Psychiatric Medication Trials: Duloxetine 90, Lexapro, Viibryd, Depakote 1500  Review of Systems:  Review of Systems  Neurological: Negative for tremors and weakness.  Psychiatric/Behavioral: Positive for depression.    Medications: I have reviewed the patient's current medications.  Current Outpatient Medications  Medication Sig Dispense Refill  . Azilsartan-Chlorthalidone (EDARBYCLOR) 40-12.5 MG TABS Take by mouth.    . divalproex (DEPAKOTE ER) 500 MG 24 hr tablet Take 1,500 mg by mouth daily.     . DULoxetine (CYMBALTA) 30 MG capsule  Take 90 mg by mouth daily.     . naltrexone (DEPADE) 50 MG tablet Take 1 tablet (50 mg total) by mouth daily. 90 tablet 3   No current facility-administered medications for this visit.    Medication Side Effects: None  Allergies: No Known Allergies  Past Medical History:  Diagnosis Date  . Anxiety   . Hypertension     Family History  Problem Relation Age of Onset  . Tremor Mother   . Diabetes Mother   . Diabetes Father   . Atrial fibrillation Father   . Heart disease Father   . Lymphoma Father   . Tremor Maternal Grandmother     Social History   Socioeconomic History  . Marital status: Unknown    Spouse name: Not on file  . Number of children: 2  . Years of education: BA  . Highest education level: Not on file  Occupational History  . Occupation: Engineer, drilling  Tobacco Use  . Smoking status: Never Smoker  . Smokeless tobacco: Never Used  Substance and Sexual Activity  . Alcohol use: Yes  . Drug use: No  . Sexual activity: Not on file  Other Topics Concern  . Not on file  Social History Narrative   Lives with wife   Caffeine use: daily   Right handed    Social Determinants of Health   Financial Resource Strain:   . Difficulty of Paying Living Expenses: Not on file  Food Insecurity:   . Worried About [employer] of  Food in the Last Year: Not on file  . Ran Out of Food in the Last Year: Not on file  Transportation Needs:   . Lack of Transportation (Medical): Not on file  . Lack of Transportation (Non-Medical): Not on file  Physical Activity:   . Days of Exercise per Week: Not on file  . Minutes of Exercise per Session: Not on file  Stress:   . Feeling of Stress : Not on file  Social Connections:   . Frequency of Communication with Friends and Family: Not on file  . Frequency of Social Gatherings with Friends and Family: Not on file  . Attends Religious Services: Not on file  . Active Member of Clubs or Organizations: Not on file  . Attends Theatre manager Meetings: Not on file  . Marital Status: Not on file  Intimate Partner Violence:   . Fear of Current or Ex-Partner: Not on file  . Emotionally Abused: Not on file  . Physically Abused: Not on file  . Sexually Abused: Not on file    Past Medical History, Surgical history, Social history, and Family history were reviewed and updated as appropriate.   Please see review of systems for further details on the patient's review from today.   Objective:   Physical Exam:  There were no vitals taken for this visit.  Physical Exam Constitutional:      General: He is not in acute distress.    Appearance: He is well-developed.  Musculoskeletal:        General: No deformity.  Neurological:     Mental Status: He is alert and oriented to person, place, and time.     Coordination: Coordination normal.  Psychiatric:        Attention and Perception: He is attentive. He does not perceive auditory hallucinations.        Mood and Affect: Mood is not anxious or depressed. Affect is not labile, blunt, angry or inappropriate.        Speech: Speech normal.        Behavior: Behavior normal.        Thought Content: Thought content normal. Thought content does not include homicidal or suicidal ideation. Thought content does not include homicidal or suicidal plan.        Cognition and Memory: Cognition normal.        Judgment: Judgment normal.     Comments: Insight intact. No auditory or visual hallucinations. No delusions.      Lab Review:     Component Value Date/Time   NA 141 10/16/2019 0421   K 3.4 (L) 10/16/2019 0421   CL 99 10/16/2019 0421   CO2 31 10/16/2019 0421   GLUCOSE 119 (H) 10/16/2019 0421   BUN 13 10/16/2019 0421   CREATININE 0.91 10/16/2019 0421   CALCIUM 8.6 (L) 10/16/2019 0421   PROT 7.4 10/13/2019 1623   ALBUMIN 3.5 10/16/2019 0421   AST 28 10/13/2019 1623   ALT 31 10/13/2019 1623   ALKPHOS 37 (L) 10/13/2019 1623   BILITOT 1.1 10/13/2019 1623   GFRNONAA >60  10/16/2019 0421   GFRAA >60 10/16/2019 0421       Component Value Date/Time   WBC 7.1 10/13/2019 1623   RBC 4.10 (L) 10/13/2019 1623   HGB 14.0 10/13/2019 1623   HCT 37.8 (L) 10/13/2019 1623   PLT 234 10/13/2019 1623   MCV 92.2 10/13/2019 1623   MCH 34.1 (H) 10/13/2019 1623   MCHC 37.0 (H) 10/13/2019 1623  RDW 11.9 10/13/2019 1623   LYMPHSABS 2.2 10/13/2019 1623   MONOABS 0.8 10/13/2019 1623   EOSABS 0.1 10/13/2019 1623   BASOSABS 0.0 10/13/2019 1623    No results found for: POCLITH, LITHIUM   No results found for: PHENYTOIN, PHENOBARB, VALPROATE, CBMZ   .res Assessment: Plan:    Chad Reynolds was seen today for follow-up, depression and stress.  Diagnoses and all orders for this visit:  Depression, major, recurrent, in partial remission ([place])  Alcohol dependence in remission [facility])   Chad Reynolds has had a good response to a combination of duloxetine plus Depakote.  Depakote was added for irritability which was not adequately addressed by the duloxetine.  His depression is under control.  His irritability is under control.  His wife has had a lot of input and she agrees with a good response.  Chad Reynolds is also been able to greatly reduce his alcohol intake and appears to no longer be abusing alcohol.  His wife would like him to completely stop which she has not done but he is able to go for days sometimes weeks at a time with no alcohol.  No indication for medication changes. OK off the Campral.  Follow-up 6 months or sooner as needed  Chad Parents MD, DFAPA  Please see After Visit Summary for patient specific instructions.  No future appointments.  No orders of the defined types were placed in this encounter.   -------------------------------

## 2019-12-09 ENCOUNTER — Telehealth: Payer: Self-pay | Admitting: Adult Health

## 2019-12-09 NOTE — Telephone Encounter (Signed)
Received a new hem referral from Dr. Shelia Media for anemia. Pt has been cld and scheduled to see Wilber Bihari on 2/3 at 830am w/labs at 8am. Pt aware to arrive 15 minutes early.

## 2019-12-13 ENCOUNTER — Other Ambulatory Visit: Payer: Self-pay | Admitting: *Deleted

## 2019-12-14 ENCOUNTER — Inpatient Hospital Stay: Payer: 59 | Attending: Adult Health | Admitting: Adult Health

## 2019-12-14 ENCOUNTER — Inpatient Hospital Stay: Payer: 59

## 2019-12-14 ENCOUNTER — Encounter: Payer: Self-pay | Admitting: Adult Health

## 2019-12-14 ENCOUNTER — Other Ambulatory Visit: Payer: Self-pay

## 2019-12-14 VITALS — BP 113/82 | HR 78 | Temp 98.2°F | Resp 20 | Ht 69.0 in | Wt 227.6 lb

## 2019-12-14 DIAGNOSIS — Z8616 Personal history of COVID-19: Secondary | ICD-10-CM | POA: Diagnosis not present

## 2019-12-14 DIAGNOSIS — Z806 Family history of leukemia: Secondary | ICD-10-CM | POA: Insufficient documentation

## 2019-12-14 DIAGNOSIS — D649 Anemia, unspecified: Secondary | ICD-10-CM

## 2019-12-14 DIAGNOSIS — I10 Essential (primary) hypertension: Secondary | ICD-10-CM | POA: Diagnosis not present

## 2019-12-14 DIAGNOSIS — R5383 Other fatigue: Secondary | ICD-10-CM | POA: Insufficient documentation

## 2019-12-14 DIAGNOSIS — F419 Anxiety disorder, unspecified: Secondary | ICD-10-CM | POA: Insufficient documentation

## 2019-12-14 DIAGNOSIS — Z79899 Other long term (current) drug therapy: Secondary | ICD-10-CM | POA: Insufficient documentation

## 2019-12-14 DIAGNOSIS — E291 Testicular hypofunction: Secondary | ICD-10-CM | POA: Insufficient documentation

## 2019-12-14 LAB — CBC WITH DIFFERENTIAL (CANCER CENTER ONLY)
Abs Immature Granulocytes: 0.02 10*3/uL (ref 0.00–0.07)
Basophils Absolute: 0.1 10*3/uL (ref 0.0–0.1)
Basophils Relative: 1 %
Eosinophils Absolute: 0.2 10*3/uL (ref 0.0–0.5)
Eosinophils Relative: 3 %
HCT: 40.6 % (ref 39.0–52.0)
Hemoglobin: 14 g/dL (ref 13.0–17.0)
Immature Granulocytes: 0 %
Lymphocytes Relative: 42 %
Lymphs Abs: 2.7 10*3/uL (ref 0.7–4.0)
MCH: 30.6 pg (ref 26.0–34.0)
MCHC: 34.5 g/dL (ref 30.0–36.0)
MCV: 88.6 fL (ref 80.0–100.0)
Monocytes Absolute: 0.5 10*3/uL (ref 0.1–1.0)
Monocytes Relative: 8 %
Neutro Abs: 2.9 10*3/uL (ref 1.7–7.7)
Neutrophils Relative %: 46 %
Platelet Count: 242 10*3/uL (ref 150–400)
RBC: 4.58 MIL/uL (ref 4.22–5.81)
RDW: 12.1 % (ref 11.5–15.5)
WBC Count: 6.4 10*3/uL (ref 4.0–10.5)
nRBC: 0 % (ref 0.0–0.2)

## 2019-12-14 LAB — RETICULOCYTES
Immature Retic Fract: 13.7 % (ref 2.3–15.9)
RBC.: 4.56 MIL/uL (ref 4.22–5.81)
Retic Count, Absolute: 69.8 10*3/uL (ref 19.0–186.0)
Retic Ct Pct: 1.5 % (ref 0.4–3.1)

## 2019-12-14 LAB — CMP (CANCER CENTER ONLY)
ALT: 20 U/L (ref 0–44)
AST: 16 U/L (ref 15–41)
Albumin: 4.1 g/dL (ref 3.5–5.0)
Alkaline Phosphatase: 45 U/L (ref 38–126)
Anion gap: 10 (ref 5–15)
BUN: 18 mg/dL (ref 6–20)
CO2: 30 mmol/L (ref 22–32)
Calcium: 9.2 mg/dL (ref 8.9–10.3)
Chloride: 100 mmol/L (ref 98–111)
Creatinine: 1 mg/dL (ref 0.61–1.24)
GFR, Est AFR Am: >60 mL/min (ref >60)
GFR, Estimated: >60 mL/min (ref >60)
Glucose, Bld: 99 mg/dL (ref 70–99)
Potassium: 3.7 mmol/L (ref 3.5–5.1)
Sodium: 140 mmol/L (ref 135–145)
Total Bilirubin: 0.4 mg/dL (ref 0.3–1.2)
Total Protein: 7 g/dL (ref 6.5–8.1)

## 2019-12-14 LAB — LACTATE DEHYDROGENASE: LDH: 191 U/L (ref 98–192)

## 2019-12-14 LAB — IRON AND TIBC
Iron: 100 ug/dL (ref 42–163)
Saturation Ratios: 24 % (ref 20–55)
TIBC: 411 ug/dL — ABNORMAL HIGH (ref 202–409)
UIBC: 311 ug/dL (ref 117–376)

## 2019-12-14 LAB — SAVE SMEAR(SSMR), FOR PROVIDER SLIDE REVIEW

## 2019-12-14 LAB — FERRITIN: Ferritin: 49 ng/mL (ref 24–336)

## 2019-12-14 NOTE — Progress Notes (Addendum)
[place]  [phone] [phone]     ID: [name] DOB: November 28, 1960  MR#: [id number]  DSK#:[id number]  Patient Care Team: Deland Pretty, MD as PCP - General (Internal Medicine) Cottle, Billey Co., MD as Attending Physician (Psychiatry) Magrinat, Virgie Dad, MD as Consulting Physician (Oncology) Scot Dock, NP OTHER MD:  CHIEF COMPLAINT: anemia  CURRENT TREATMENT: Observation   HISTORY OF CURRENT ILLNESS: Chad Reynolds is here today at the request of Dr. Shelia Media for unexplained anemia.  This was first noted on 10/23/2020 at his CPE on lab work.  Chad Reynolds was diagnosed with COVID and hospitalized at the end of November, 2021, prior to his exam with his PCP.  He notes that he is recovering well and has had no other issues with anemia that he is aware of.  He also has a history of low testosterone that he is being treated for with a gel.    The patient's subsequent history is as detailed below.  INTERVAL HISTORY: Chad Reynolds is doing well today.  He notes that he has mild residual fatigue from COVID, but has no other issues such as night sweats, lymphadenopathy, unexplained weight loss.  He notes that he is not exercising currently with gym closures, but is planning on doing that soon.    REVIEW OF SYSTEMS:  Chad Reynolds denies any fever, chills, chest pain cough, shortness of breath, wheezing, bowel/bladder changes, dysphagia, loss of appetite, vision changes, headaches, nausea, vomiting, skin rash or lesion, easy bruising or bleeding, or any other concern.  A detailed ROS was otherwise non contributory.  PAST MEDICAL HISTORY: Past Medical History:  Diagnosis Date  . Anxiety   . Hypertension     PAST SURGICAL HISTORY: Past Surgical History:  Procedure Laterality Date  . CATARACT EXTRACTION Bilateral     FAMILY HISTORY Family History  Problem Relation Age of Onset  . Tremor Mother   . Diabetes Mother   . Diabetes Father   . Atrial fibrillation  Father   . Heart disease Father   . Lymphoma Father   . Tremor Maternal Grandmother     SOCIAL HISTORY: Married, lives with his wife.  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  He has twins, his son is in [place], and his daughter is living in [place].  He is a never smoker, drinks 3-4 beers per week, and denies any drug use.       ADVANCED DIRECTIVES: not in place   HEALTH MAINTENANCE: Social History   Tobacco Use  . Smoking status: Never Smoker  . Smokeless tobacco: Never Used  Substance Use Topics  . Alcohol use: Yes  . Drug use: No     Colonoscopy: 2020--checked every 2-3 years due to h/o polyps  PSA: followed regularly by Dr. Shelia Media   No Known Allergies  Current Outpatient Medications  Medication Sig Dispense Refill  . acamprosate (CAMPRAL) 333 MG tablet Take 666 mg by mouth 3 (three) times daily.    . Azilsartan-Chlorthalidone (EDARBYCLOR) 40-12.5 MG TABS Take by mouth.    . divalproex (DEPAKOTE ER) 500 MG 24 hr tablet Take 3 tablets (1,500 mg total) by mouth daily. 270 tablet 1  . DULoxetine (CYMBALTA) 30 MG capsule Take 3 capsules (90 mg total) by mouth daily. 270 capsule 1  . naltrexone (DEPADE) 50 MG tablet Take 1 tablet (50 mg total) by mouth daily. 90 tablet 3  . TESTIM 50 MG/5GM (1%) GEL 5 g daily.    Marland Kitchen triamterene-hydrochlorothiazide (MAXZIDE-25) 37.5-25  MG tablet      No current facility-administered medications for this visit.    OBJECTIVE:  Vitals:   12/14/19 0831  BP: 113/82  Pulse: 78  Resp: 20  Temp: 98.2 F (36.8 C)  SpO2: 99%     Body mass index is 33.61 kg/m.   Wt Readings from Last 3 Encounters:  12/14/19 227 lb 9.6 oz (103.2 kg)  10/14/19 214 lb 15.2 oz (97.5 kg)  12/17/17 215 lb (97.5 kg)      ECOG FS:0 - Asymptomatic  GENERAL: Patient is a well appearing male in no acute distress HEENT:  Sclerae anicteric. Mask in place. Neck is supple.  NODES:  No cervical, supraclavicular, or axillary lymphadenopathy palpated.   LUNGS:  Clear to auscultation bilaterally.  No wheezes or rhonchi. HEART:  Regular rate and rhythm. No murmur appreciated. ABDOMEN:  Soft, nontender.  Positive, normoactive bowel sounds. No organomegaly palpated. MSK:  No focal spinal tenderness to palpation. EXTREMITIES:  No peripheral edema.   SKIN:  Clear with no obvious rashes or skin changes. No nail dyscrasia. NEURO:  Nonfocal. Well oriented.  Appropriate affect.    LAB RESULTS:  CMP     Component Value Date/Time   NA 140 12/14/2019 0815   K 3.7 12/14/2019 0815   CL 100 12/14/2019 0815   CO2 30 12/14/2019 0815   GLUCOSE 99 12/14/2019 0815   BUN 18 12/14/2019 0815   CREATININE 1.00 12/14/2019 0815   CALCIUM 9.2 12/14/2019 0815   PROT 7.0 12/14/2019 0815   ALBUMIN 4.1 12/14/2019 0815   AST 16 12/14/2019 0815   ALT 20 12/14/2019 0815   ALKPHOS 45 12/14/2019 0815   BILITOT 0.4 12/14/2019 0815   GFRNONAA >60 12/14/2019 0815   GFRAA >60 12/14/2019 0815    No results found for: Ronnald Ramp, A1GS, A2GS, BETS, BETA2SER, GAMS, MSPIKE, SPEI  No results found for: Nils Pyle, [facility]  Lab Results  Component Value Date   WBC 6.4 12/14/2019   NEUTROABS 2.9 12/14/2019   HGB 14.0 12/14/2019   HCT 40.6 12/14/2019   MCV 88.6 12/14/2019   PLT 242 12/14/2019      Chemistry      Component Value Date/Time   NA 140 12/14/2019 0815   K 3.7 12/14/2019 0815   CL 100 12/14/2019 0815   CO2 30 12/14/2019 0815   BUN 18 12/14/2019 0815   CREATININE 1.00 12/14/2019 0815      Component Value Date/Time   CALCIUM 9.2 12/14/2019 0815   ALKPHOS 45 12/14/2019 0815   AST 16 12/14/2019 0815   ALT 20 12/14/2019 0815   BILITOT 0.4 12/14/2019 0815       No results found for: LABCA2  No components found for: [id number]  No results for input(s): INR in the last 168 hours.  No results found for: LABCA2  No results found for: OHF290  No results found for: SXJ155  No results found for: MCE022  No  results found for: CA2729  No components found for: HGQUANT  No results found for: CEA1 / No results found for: CEA1   No results found for: AFPTUMOR  No results found for: CHROMOGRNA  No results found for: PSA1  Appointment on 12/14/2019  Component Date Value Ref Range Status  . WBC Count 12/14/2019 6.4  4.0 - 10.5 K/uL Final  . RBC 12/14/2019 4.58  4.22 - 5.81 MIL/uL Final  . Hemoglobin 12/14/2019 14.0  13.0 - 17.0 g/dL Final  . HCT 12/14/2019 40.6  39.0 -  52.0 % Final  . MCV 12/14/2019 88.6  80.0 - 100.0 fL Final  . MCH 12/14/2019 30.6  26.0 - 34.0 pg Final  . MCHC 12/14/2019 34.5  30.0 - 36.0 g/dL Final  . RDW 12/14/2019 12.1  11.5 - 15.5 % Final  . Platelet Count 12/14/2019 242  150 - 400 K/uL Final  . nRBC 12/14/2019 0.0  0.0 - 0.2 % Final  . Neutrophils Relative % 12/14/2019 46  % Final  . Neutro Abs 12/14/2019 2.9  1.7 - 7.7 K/uL Final  . Lymphocytes Relative 12/14/2019 42  % Final  . Lymphs Abs 12/14/2019 2.7  0.7 - 4.0 K/uL Final  . Monocytes Relative 12/14/2019 8  % Final  . Monocytes Absolute 12/14/2019 0.5  0.1 - 1.0 K/uL Final  . Eosinophils Relative 12/14/2019 3  % Final  . Eosinophils Absolute 12/14/2019 0.2  0.0 - 0.5 K/uL Final  . Basophils Relative 12/14/2019 1  % Final  . Basophils Absolute 12/14/2019 0.1  0.0 - 0.1 K/uL Final  . Immature Granulocytes 12/14/2019 0  % Final  . Abs Immature Granulocytes 12/14/2019 0.02  0.00 - 0.07 K/uL Final   Performed at [facility], [place] [address] [place], [place] [address]  . Retic Ct Pct 12/14/2019 1.5  0.4 - 3.1 % Final  . RBC. 12/14/2019 4.56  4.22 - 5.81 MIL/uL Final  . Retic Count, Absolute 12/14/2019 69.8  19.0 - 186.0 K/uL Final  . Immature Retic Fract 12/14/2019 13.7  2.3 - 15.9 % Final   Performed at [facility], [place] [address] [place], [place] [address]  . Smear Review 12/14/2019 SMEAR STAINED AND AVAILABLE FOR REVIEW   Final   Performed at [facility], [address] [place], [place] [address]  . Sodium 12/14/2019 140  135 - 145 mmol/L Final  . Potassium 12/14/2019 3.7  3.5 - 5.1 mmol/L Final  . Chloride 12/14/2019 100  98 - 111 mmol/L Final  . CO2 12/14/2019 30  22 - 32 mmol/L Final  . Glucose, Bld 12/14/2019 99  70 - 99 mg/dL Final  . BUN 12/14/2019 18  6 - 20 mg/dL Final  . Creatinine 12/14/2019 1.00  0.61 - 1.24 mg/dL Final  . Calcium 12/14/2019 9.2  8.9 - 10.3 mg/dL Final  . Total Protein 12/14/2019 7.0  6.5 - 8.1 g/dL Final  . Albumin 12/14/2019 4.1  3.5 - 5.0 g/dL Final  . AST 12/14/2019 16  15 - 41 U/L Final  . ALT 12/14/2019 20  0 - 44 U/L Final  . Alkaline Phosphatase 12/14/2019 45  38 - 126 U/L Final  . Total Bilirubin 12/14/2019 0.4  0.3 - 1.2 mg/dL Final  . GFR, Est Non Af Am 12/14/2019 >60  >60 mL/min Final  . GFR, Est AFR Am 12/14/2019 >60  >60 mL/min Final  . Anion gap 12/14/2019 10  5 - 15 Final   Performed at [facility], [place] [address], [place], [place] [address]  . LDH 12/14/2019 191  98 - 192 U/L Final   Performed at [facility], [place] [address], [place], [place] [address]    (this displays the last labs from the last 3 days)  No results found for: TOTALPROTELP, ALBUMINELP, A1GS, A2GS, BETS, BETA2SER, GAMS, MSPIKE, SPEI (this displays SPEP labs)  No results found for: KPAFRELGTCHN, LAMBDASER, KAPLAMBRATIO (kappa/lambda light chains)  No results found for: HGBA, HGBA2QUANT, HGBFQUANT, HGBSQUAN (Hemoglobinopathy  evaluation)   Lab Results  Component Value Date   LDH 191 12/14/2019    No results found for: IRON, TIBC, IRONPCTSAT (Iron and TIBC)  Lab Results  Component Value Date   FERRITIN 135 10/13/2019    Urinalysis    Component Value Date/Time   COLORURINE AMBER (A) 10/14/2019 0330   APPEARANCEUR CLEAR 10/14/2019 0330   LABSPEC 1.017 10/14/2019 0330   PHURINE 5.0 10/14/2019 0330   GLUCOSEU NEGATIVE 10/14/2019 0330    HGBUR NEGATIVE 10/14/2019 0330   BILIRUBINUR NEGATIVE 10/14/2019 0330   KETONESUR 5 (A) 10/14/2019 0330   PROTEINUR NEGATIVE 10/14/2019 0330   NITRITE NEGATIVE 10/14/2019 0330   LEUKOCYTESUR NEGATIVE 10/14/2019 0330     STUDIES: No results found.   ASSESSMENT: 59 y.o. [place] man here today for consultation for anemia.    1.  Noted on 10/23/2020 with unexplained hemoglobin of 13   (a) potential etiologies include recent COVID19 or testosterone deficiency  2. Repeat on 12/14/2019 shows that hemoglobin is 14 and normal   PLAN:  Chad Reynolds met with Dr. Jana Hakim to review his labs and discuss his previous anemia with him in detail.  We discussed that there are three types of blood cells that are made in the bone marrow.  These cells are:  1. White blood cells, or immune cells.  Chad Reynolds are normal in number and appearance.    2. Platelets, clotting cells.  Chad Reynolds are normal in number and appearance.  3.  Red blood cells: oxygen carrying cells.  His were minimally low previously, however today they are normal in number and appearance.    Potential etiologies for his anemia include: COVID 19, any inflammation in the body can result in anemia, and also testosterone deficiency.  Dr. Jana Hakim recommends Chad Reynolds continuing to f/u with Dr. Shelia Media and receiving replacement, but also noted that replacement can enlarge the prostate, so that was recommended to be followed.    Chad Reynolds verbalized understanding of the above.  Since Chad Reynolds's hemoglobin has normalized, he does not require further follow up at this point.  Should the need arise in the future for additional consultation or follow up, we are happy to see him.    Wilber Bihari, NP   12/14/2019 9:41 AM Medical Oncology and Hematology [facility] [address] [place], [place] [address] Tel. [phone]    Fax. [phone]   ADDENDUM: I met with this 59 year old Chad Reynolds man with no prior history of anemia Results  for JAMES, SENN (MRN [id number]) as of 12/17/2019 19:08  Ref. Range 09/22/2011 10:44 10/13/2019 16:23 12/14/2019 08:15  Hemoglobin Latest Ref Range: 13.0 - 17.0 g/dL 14.1 14.0 14.0   He was referred for mild normocytic anemia in the setting of recent COVID-19 infection.  Additional labs include a ferritin of 135 on 10/13/2019 and 49 today, both normal also normal LDH that same day and repeat today, negative HIV 10/14/2019 , no evidence of renal dysfunction on c-Met today and a reticulocyte count of 69.8  Review of the peripheral blood blood film shows no significant anisocytosis, no left shift on the white cell series, and no artifactual platelet clumping.  We reviewed the different types of blood cells that normally circulate, and their function.  He understands that he temporarily had a drop in the red cell series, but that this has resolved.  The most likely reason for this is his recent COVID-19 infection.  In addition he does have a history of testosterone deficiency.  I do not have  a recent testosterone level but inadequate testosterone is a cause of normocytic anemia and I concurred with his continuing replacement.  He tells me his PSAs being followed regularly by Dr. Shelia Media  At this point his anemia appears to have resolved without any intervention.  I reassured him that there is no apparent hematologic problem.  I will be glad to see him again as needed in the future but at this point we are not making any further routine appointment for him here.  I personally saw this patient and performed a substantive portion of this encounter with the listed APP documented above.   Total encounter time 40 minutes.Chauncey Cruel, MD Medical Oncology and Hematology [facility] [address] [place], [place] [address] Tel. [phone]    Fax. [phone]  *Total Encounter Time as defined by the Centers for Medicare and Medicaid Services includes, in addition to the face-to-face  time of a patient visit (documented in the note above) non-face-to-face time: obtaining and reviewing outside history, ordering and reviewing medications, tests or procedures, care coordination (communications with other health care professionals or caregivers) and documentation in the medical record.

## 2020-03-02 ENCOUNTER — Other Ambulatory Visit: Payer: Self-pay | Admitting: Internal Medicine

## 2020-03-02 DIAGNOSIS — E236 Other disorders of pituitary gland: Secondary | ICD-10-CM

## 2020-03-30 ENCOUNTER — Ambulatory Visit
Admission: RE | Admit: 2020-03-30 | Discharge: 2020-03-30 | Disposition: A | Discharge status: Home / Self Care | Payer: 59 | Source: Ambulatory Visit | Attending: Internal Medicine | Admitting: Internal Medicine

## 2020-03-30 ENCOUNTER — Other Ambulatory Visit: Payer: Self-pay

## 2020-03-30 DIAGNOSIS — E236 Other disorders of pituitary gland: Secondary | ICD-10-CM

## 2020-03-30 MED ORDER — GADOBENATE DIMEGLUMINE 529 MG/ML IV SOLN
15.0000 mL | Freq: Once | INTRAVENOUS | Status: AC | PRN
  Administered 2020-03-30: 14 mL via INTRAVENOUS

## 2020-04-26 ENCOUNTER — Ambulatory Visit (INDEPENDENT_AMBULATORY_CARE_PROVIDER_SITE_OTHER): Payer: 59 | Admitting: Psychiatry

## 2020-04-26 ENCOUNTER — Encounter: Payer: Self-pay | Admitting: Psychiatry

## 2020-04-26 ENCOUNTER — Other Ambulatory Visit: Payer: Self-pay

## 2020-04-26 DIAGNOSIS — F3341 Major depressive disorder, recurrent, in partial remission: Secondary | ICD-10-CM

## 2020-04-26 DIAGNOSIS — F331 Major depressive disorder, recurrent, moderate: Secondary | ICD-10-CM

## 2020-04-26 DIAGNOSIS — F1021 Alcohol dependence, in remission: Secondary | ICD-10-CM

## 2020-04-26 MED ORDER — DULOXETINE HCL 60 MG PO CPEP: 120.0000 mg | ORAL_CAPSULE | Freq: Every day | ORAL | 2 refills | Status: DC

## 2020-04-26 MED ORDER — DIVALPROEX SODIUM ER 500 MG PO TB24: 1500.0000 mg | ORAL_TABLET | Freq: Every day | ORAL | 1 refills | Status: DC

## 2020-04-26 NOTE — Progress Notes (Signed)
JW COVIN [id number] [birth year] 59 y.o.  Subjective:   Patient ID:  Chad Reynolds is a 59 y.o. (DOB 1961/05/12) male.  Chief Complaint:  Chief Complaint  Patient presents with  . Follow-up    mood and fatigue and meds  . Fatigue  . Depression    Depression        Associated symptoms include fatigue.  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  Last visit was December 2020.  he was doing well.  No meds were changed.  He was taking duloxetine 90 mg, Depakote ER 1500 mg, naltrexone, and stopped Campral.  04/26/20 appt with following noted:  Seen with wife. Both hospitalized with Covid in December.   Fully recovered.  Vaccinated. Work up for fatigue testosterone started.  Not in normal range yet. Some ups and downs lately good and bad without reason. Wife noticing more depression with negative thoughts and dwelling on death.  But mood swings are under control.  No SE he notices.  Wife sees more scratching of himself but he says it's a chronic problem.  Really busy working 2 jobs.    Patient reports stable mood and denies irritable moods. He agrees he's more negative and down.   Patient denies any recent difficulty with anxiety.  Patient has occ difficulty with sleep initiation or maintenance. Denies appetite disturbance.  Patient reports that energy and motivation have been good.  Patient denies any difficulty with concentration.  Patient denies any suicidal ideation.  Sometimes crazy dreams.  Occ beer without excess now.  Minimal alcohol.  No longer hangs out with friends who drink to excess.  Past Psychiatric Medication Trials: Duloxetine 90, Lexapro, Viibryd, Depakote 1500  Review of Systems:  Review of Systems  Constitutional: Positive for fatigue.  Neurological: Negative for tremors and weakness.  Psychiatric/Behavioral: Positive for depression.    Medications: I have  reviewed the patient's current medications.  Current Outpatient Medications  Medication Sig Dispense Refill  . acamprosate (CAMPRAL) 333 MG tablet Take 666 mg by mouth 3 (three) times daily.    . Azilsartan-Chlorthalidone (EDARBYCLOR) 40-12.5 MG TABS Take by mouth.    . divalproex (DEPAKOTE ER) 500 MG 24 hr tablet Take 3 tablets (1,500 mg total) by mouth daily. 270 tablet 1  . DULoxetine (CYMBALTA) 30 MG capsule Take 3 capsules (90 mg total) by mouth daily. 270 capsule 1  . naltrexone (DEPADE) 50 MG tablet Take 1 tablet (50 mg total) by mouth daily. 90 tablet 3  . TESTIM 50 MG/5GM (1%) GEL 5 g daily.     No current facility-administered medications for this visit.    Medication Side Effects: None  Allergies: No Known Allergies  Past Medical History:  Diagnosis Date  . Anxiety   . Hypertension     Family History  Problem Relation Age of Onset  . Tremor Mother   . Diabetes Mother   . Diabetes Father   . Atrial fibrillation Father   . Heart disease Father   . Lymphoma Father   . Tremor Maternal Grandmother     Social History   Socioeconomic History  . Marital status: Unknown    Spouse name: Not on file  . Number of children: 2  . Years of education: BA  . Highest education level: Not on file  Occupational History  . Occupation: Engineer, drilling  Tobacco Use  . Smoking status: Never Smoker  . Smokeless tobacco:  Never Used  Vaping Use  . Vaping Use: Never used  Substance and Sexual Activity  . Alcohol use: Yes  . Drug use: No  . Sexual activity: Not on file  Other Topics Concern  . Not on file  Social History Narrative   Lives with wife   Caffeine use: daily   Right handed    Social Determinants of Health   Financial Resource Strain:   . Difficulty of Paying Living Expenses:   Food Insecurity:   . Worried About Charity fundraiser in the Last Year:   . Arboriculturist in the Last Year:   Transportation Needs:   . Film/video editor (Medical):   Marland Kitchen Lack of  Transportation (Non-Medical):   Physical Activity:   . Days of Exercise per Week:   . Minutes of Exercise per Session:   Stress:   . Feeling of Stress :   Social Connections:   . Frequency of Communication with Friends and Family:   . Frequency of Social Gatherings with Friends and Family:   . Attends Religious Services:   . Active Member of Clubs or Organizations:   . Attends Archivist Meetings:   Marland Kitchen Marital Status:   Intimate Partner Violence:   . Fear of Current or Ex-Partner:   . Emotionally Abused:   Marland Kitchen Physically Abused:   . Sexually Abused:     Past Medical History, Surgical history, Social history, and Family history were reviewed and updated as appropriate.   Please see review of systems for further details on the patient's review from today.   Objective:   Physical Exam:  There were no vitals taken for this visit.  Physical Exam Constitutional:      General: He is not in acute distress.    Appearance: He is well-developed.  Musculoskeletal:        General: No deformity.  Neurological:     Mental Status: He is alert and oriented to person, place, and time.     Coordination: Coordination normal.  Psychiatric:        Attention and Perception: He is attentive. He does not perceive auditory hallucinations.        Mood and Affect: Mood is depressed. Mood is not anxious. Affect is not labile, blunt, angry or inappropriate.        Speech: Speech normal.        Behavior: Behavior normal.        Thought Content: Thought content normal. Thought content does not include homicidal or suicidal ideation. Thought content does not include homicidal or suicidal plan.        Cognition and Memory: Cognition normal.        Judgment: Judgment normal.     Comments: Insight intact. No auditory or visual hallucinations. No delusions.      Lab Review:     Component Value Date/Time   NA 140 12/14/2019 0815   K 3.7 12/14/2019 0815   CL 100 12/14/2019 0815   CO2 30  12/14/2019 0815   GLUCOSE 99 12/14/2019 0815   BUN 18 12/14/2019 0815   CREATININE 1.00 12/14/2019 0815   CALCIUM 9.2 12/14/2019 0815   PROT 7.0 12/14/2019 0815   ALBUMIN 4.1 12/14/2019 0815   AST 16 12/14/2019 0815   ALT 20 12/14/2019 0815   ALKPHOS 45 12/14/2019 0815   BILITOT 0.4 12/14/2019 0815   GFRNONAA >60 12/14/2019 0815   GFRAA >60 12/14/2019 0815       Component  Value Date/Time   WBC 6.4 12/14/2019 0815   WBC 7.1 10/13/2019 1623   RBC 4.58 12/14/2019 0815   RBC 4.56 12/14/2019 0815   HGB 14.0 12/14/2019 0815   HCT 40.6 12/14/2019 0815   PLT 242 12/14/2019 0815   MCV 88.6 12/14/2019 0815   MCH 30.6 12/14/2019 0815   MCHC 34.5 12/14/2019 0815   RDW 12.1 12/14/2019 0815   LYMPHSABS 2.7 12/14/2019 0815   MONOABS 0.5 12/14/2019 0815   EOSABS 0.2 12/14/2019 0815   BASOSABS 0.1 12/14/2019 0815    No results found for: POCLITH, LITHIUM   No results found for: PHENYTOIN, PHENOBARB, VALPROATE, CBMZ   .res Assessment: Plan:    Chad Reynolds was seen today for follow-up, fatigue and depression.  Diagnoses and all orders for this visit:  Major depressive disorder, recurrent episode, moderate (HCC)  Alcohol dependence in remission [facility])   Chad Reynolds has had a good response to a combination of duloxetine plus Depakote.  Depakote was added for irritability which was not adequately addressed by the duloxetine.  His depression is no longer under control.  His irritability is under control.  His wife has had a lot of input and she agrees with a good response.  For depression increase duloxetine to 120 mg daily.  Chad Reynolds is also been able to greatly reduce his alcohol intake and appears to no longer be abusing alcohol.  His wife would like him to completely stop which she has not done but he is able to go for days sometimes weeks at a time with no alcohol. Wife agrees reduced alcohol.  No indication for medication changes. OK off the Okay today.  Follow-up 2  mos  Lynder Parents MD, DFAPA  Please see After Visit Summary for patient specific instructions.  No future appointments.  No orders of the defined types were placed in this encounter.   -------------------------------

## 2020-06-28 ENCOUNTER — Other Ambulatory Visit: Payer: Self-pay

## 2020-06-28 ENCOUNTER — Ambulatory Visit (INDEPENDENT_AMBULATORY_CARE_PROVIDER_SITE_OTHER): Payer: 59 | Admitting: Psychiatry

## 2020-06-28 ENCOUNTER — Encounter: Payer: Self-pay | Admitting: Psychiatry

## 2020-06-28 DIAGNOSIS — F1021 Alcohol dependence, in remission: Secondary | ICD-10-CM | POA: Diagnosis not present

## 2020-06-28 DIAGNOSIS — F3341 Major depressive disorder, recurrent, in partial remission: Secondary | ICD-10-CM | POA: Diagnosis not present

## 2020-06-28 MED ORDER — DULOXETINE HCL 60 MG PO CPEP: 120.0000 mg | ORAL_CAPSULE | Freq: Every day | ORAL | 0 refills | Status: DC

## 2020-06-28 MED ORDER — DIVALPROEX SODIUM ER 500 MG PO TB24: 1500.0000 mg | ORAL_TABLET | Freq: Every day | ORAL | 0 refills | Status: DC

## 2020-06-28 NOTE — Progress Notes (Signed)
Chad Reynolds [id number] June 10, 1961 58 y.o.  Subjective:   Patient ID:  Chad Reynolds is a 59 y.o. (DOB Dec 28, 1960) male.  Chief Complaint:  Chief Complaint  Patient presents with  . Follow-up  . Depression  . Anxiety    Depression        Associated symptoms include fatigue.  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  Last visit was December 2020.  he was doing well.  No meds were changed.  He was taking duloxetine 90 mg, Depakote ER 1500 mg, naltrexone, and stopped Campral.  04/26/20 appt with following noted:  Seen with wife. Both hospitalized with Covid in December.   Fully recovered.  Vaccinated. Work up for fatigue testosterone started.  Not in normal range yet. Some ups and downs lately good and bad without reason. Wife noticing more depression with negative thoughts and dwelling on death.  But mood swings are under control.  No SE he notices.  Wife sees more scratching of himself but he says it's a chronic problem. Really busy working 2 jobs.    Patient reports stable mood and denies irritable moods. He agrees he's more negative and down.   Patient denies any recent difficulty with anxiety.  Patient has occ difficulty with sleep initiation or maintenance. Denies appetite disturbance.  Patient reports that energy and motivation have been good.  Patient denies any difficulty with concentration.  Patient denies any suicidal ideation.  Sometimes crazy dreams. Plan: For depression increase duloxetine to 120 mg daily.  06/28/20 appt with the following noted: Not taking naltrexone he thinks. Is taking duloxetine 120 mg daily and Depakote 1500 mg daily. Working 2 jobs. Helped with increase Duloxetine.  Less tense, more level.  Less down and less negative thinking as often.  Can find some things he enjoys.  Walks with Chad Reynolds.  Working on losing weight.  Today's F's 59 yo today. Some sleep  issues DT nocturia.  Occ beer without excess now.  Minimal alcohol.  No longer hangs out with friends who drink to excess.  Past Psychiatric Medication Trials: Duloxetine 90, Lexapro, Viibryd, Depakote 1500  Review of Systems:  Review of Systems  Constitutional: Positive for fatigue.  Neurological: Negative for tremors and weakness.  Psychiatric/Behavioral: Positive for depression.    Medications: I have reviewed the patient's current medications.  Current Outpatient Medications  Medication Sig Dispense Refill  . Azilsartan-Chlorthalidone (EDARBYCLOR) 40-12.5 MG TABS Take by mouth.    . divalproex (DEPAKOTE ER) 500 MG 24 hr tablet Take 3 tablets (1,500 mg total) by mouth daily. 270 tablet 0  . DULoxetine (CYMBALTA) 60 MG capsule Take 2 capsules (120 mg total) by mouth daily. 180 capsule 0  . naltrexone (DEPADE) 50 MG tablet Take 1 tablet (50 mg total) by mouth daily. 90 tablet 3  . TESTIM 50 MG/5GM (1%) GEL 5 g daily.     No current facility-administered medications for this visit.    Medication Side Effects: None  Allergies: No Known Allergies  Past Medical History:  Diagnosis Date  . Anxiety   . Hypertension     Family History  Problem Relation Age of Onset  . Tremor Mother   . Diabetes Mother   . Diabetes Father   . Atrial fibrillation Father   . Heart disease Father   . Lymphoma Father   . Tremor Maternal Grandmother     Social History   Socioeconomic History  .  Marital status: Unknown    Spouse name: Not on file  . Number of children: 2  . Years of education: BA  . Highest education level: Not on file  Occupational History  . Occupation: Engineer, drilling  Tobacco Use  . Smoking status: Never Smoker  . Smokeless tobacco: Never Used  Vaping Use  . Vaping Use: Never used  Substance and Sexual Activity  . Alcohol use: Yes  . Drug use: No  . Sexual activity: Not on file  Other Topics Concern  . Not on file  Social History Narrative   Lives with wife    Caffeine use: daily   Right handed    Social Determinants of Health   Financial Resource Strain:   . Difficulty of Paying Living Expenses: Not on file  Food Insecurity:   . Worried About Charity fundraiser in the Last Year: Not on file  . Ran Out of Food in the Last Year: Not on file  Transportation Needs:   . Lack of Transportation (Medical): Not on file  . Lack of Transportation (Non-Medical): Not on file  Physical Activity:   . Days of Exercise per Week: Not on file  . Minutes of Exercise per Session: Not on file  Stress:   . Feeling of Stress : Not on file  Social Connections:   . Frequency of Communication with Friends and Family: Not on file  . Frequency of Social Gatherings with Friends and Family: Not on file  . Attends Religious Services: Not on file  . Active Member of Clubs or Organizations: Not on file  . Attends Archivist Meetings: Not on file  . Marital Status: Not on file  Intimate Partner Violence:   . Fear of Current or Ex-Partner: Not on file  . Emotionally Abused: Not on file  . Physically Abused: Not on file  . Sexually Abused: Not on file    Past Medical History, Surgical history, Social history, and Family history were reviewed and updated as appropriate.   Please see review of systems for further details on the patient's review from today.   Objective:   Physical Exam:  There were no vitals taken for this visit.  Physical Exam Constitutional:      General: He is not in acute distress.    Appearance: He is well-developed.  Musculoskeletal:        General: No deformity.  Neurological:     Mental Status: He is alert and oriented to person, place, and time.     Coordination: Coordination normal.  Psychiatric:        Attention and Perception: He is attentive. He does not perceive auditory hallucinations.        Mood and Affect: Mood is not anxious or depressed. Affect is not labile, blunt, angry or inappropriate.        Speech: Speech  normal.        Behavior: Behavior normal.        Thought Content: Thought content normal. Thought content does not include homicidal or suicidal ideation. Thought content does not include homicidal or suicidal plan.        Cognition and Memory: Cognition normal.        Judgment: Judgment normal.     Comments: Insight intact. No auditory or visual hallucinations. No delusions.      Lab Review:     Component Value Date/Time   NA 140 12/14/2019 0815   K 3.7 12/14/2019 0815   CL 100  12/14/2019 0815   CO2 30 12/14/2019 0815   GLUCOSE 99 12/14/2019 0815   BUN 18 12/14/2019 0815   CREATININE 1.00 12/14/2019 0815   CALCIUM 9.2 12/14/2019 0815   PROT 7.0 12/14/2019 0815   ALBUMIN 4.1 12/14/2019 0815   AST 16 12/14/2019 0815   ALT 20 12/14/2019 0815   ALKPHOS 45 12/14/2019 0815   BILITOT 0.4 12/14/2019 0815   GFRNONAA >60 12/14/2019 0815   GFRAA >60 12/14/2019 0815       Component Value Date/Time   WBC 6.4 12/14/2019 0815   WBC 7.1 10/13/2019 1623   RBC 4.58 12/14/2019 0815   RBC 4.56 12/14/2019 0815   HGB 14.0 12/14/2019 0815   HCT 40.6 12/14/2019 0815   PLT 242 12/14/2019 0815   MCV 88.6 12/14/2019 0815   MCH 30.6 12/14/2019 0815   MCHC 34.5 12/14/2019 0815   RDW 12.1 12/14/2019 0815   LYMPHSABS 2.7 12/14/2019 0815   MONOABS 0.5 12/14/2019 0815   EOSABS 0.2 12/14/2019 0815   BASOSABS 0.1 12/14/2019 0815    No results found for: POCLITH, LITHIUM   No results found for: PHENYTOIN, PHENOBARB, VALPROATE, CBMZ   .res Assessment: Plan:    Chad Reynolds was seen today for follow-up, depression and anxiety.  Diagnoses and all orders for this visit:  Depression, major, recurrent, in partial remission (HCC) -     divalproex (DEPAKOTE ER) 500 MG 24 hr tablet; Take 3 tablets (1,500 mg total) by mouth daily. -     DULoxetine (CYMBALTA) 60 MG capsule; Take 2 capsules (120 mg total) by mouth daily.  Alcohol dependence in remission [facility])   Chad Reynolds has had a good response to a  combination of duloxetine plus Depakote.  Depakote was added for irritability which was not adequately addressed by the duloxetine.  His depression is again under control.  His irritability is under control.  His wife has had a lot of input and she agrees with a good response.  For depression increase duloxetine to 120 mg daily was helpful. Continue Depakote 1500 mg daily for irritability No med changes indicated.  Chad Reynolds is also been able to greatly reduce his alcohol intake and appears to no longer be abusing alcohol.  His wife would like him to completely stop which she has not done but he is able to go for days sometimes weeks at a time with no alcohol. Wife agrees reduced alcohol.  No indication for medication changes. OK off the [place] today.  Follow-up 2 mos  Lynder Parents MD, DFAPA  Please see After Visit Summary for patient specific instructions.  No future appointments.  No orders of the defined types were placed in this encounter.   -------------------------------

## 2020-11-05 ENCOUNTER — Other Ambulatory Visit: Payer: Self-pay | Admitting: Psychiatry

## 2020-11-05 DIAGNOSIS — F1021 Alcohol dependence, in remission: Secondary | ICD-10-CM

## 2020-11-05 DIAGNOSIS — F3341 Major depressive disorder, recurrent, in partial remission: Secondary | ICD-10-CM

## 2020-12-08 ENCOUNTER — Other Ambulatory Visit: Payer: Self-pay | Admitting: Psychiatry

## 2020-12-08 DIAGNOSIS — F3341 Major depressive disorder, recurrent, in partial remission: Secondary | ICD-10-CM

## 2020-12-14 ENCOUNTER — Other Ambulatory Visit: Payer: Self-pay | Admitting: Internal Medicine

## 2020-12-14 DIAGNOSIS — E7800 Pure hypercholesterolemia, unspecified: Secondary | ICD-10-CM

## 2020-12-19 ENCOUNTER — Ambulatory Visit: Payer: 59 | Admitting: Psychiatry

## 2021-01-02 ENCOUNTER — Ambulatory Visit
Admission: RE | Admit: 2021-01-02 | Discharge: 2021-01-02 | Disposition: A | Discharge status: Home / Self Care | Payer: No Typology Code available for payment source | Source: Ambulatory Visit | Attending: Internal Medicine | Admitting: Internal Medicine

## 2021-01-02 DIAGNOSIS — E7800 Pure hypercholesterolemia, unspecified: Secondary | ICD-10-CM

## 2021-01-10 ENCOUNTER — Other Ambulatory Visit: Payer: Self-pay | Admitting: Psychiatry

## 2021-01-10 DIAGNOSIS — F3341 Major depressive disorder, recurrent, in partial remission: Secondary | ICD-10-CM

## 2021-02-07 ENCOUNTER — Encounter: Payer: Self-pay | Admitting: Psychiatry

## 2021-02-07 ENCOUNTER — Ambulatory Visit (INDEPENDENT_AMBULATORY_CARE_PROVIDER_SITE_OTHER): Payer: 59 | Admitting: Psychiatry

## 2021-02-07 ENCOUNTER — Other Ambulatory Visit: Payer: Self-pay

## 2021-02-07 DIAGNOSIS — F411 Generalized anxiety disorder: Secondary | ICD-10-CM | POA: Diagnosis not present

## 2021-02-07 DIAGNOSIS — F3341 Major depressive disorder, recurrent, in partial remission: Secondary | ICD-10-CM | POA: Diagnosis not present

## 2021-02-07 DIAGNOSIS — F1021 Alcohol dependence, in remission: Secondary | ICD-10-CM | POA: Diagnosis not present

## 2021-02-07 MED ORDER — ARIPIPRAZOLE 5 MG PO TABS
ORAL_TABLET | ORAL | 1 refills | Status: DC
Start: 2021-02-07 — End: 2021-04-11

## 2021-02-07 NOTE — Progress Notes (Signed)
Chad Reynolds [id number] 01/26/1961 60 y.o.  Subjective:   Patient ID:  Chad Reynolds is a 60 y.o. (DOB Aug 08, 1961) male.  Chief Complaint:  Chief Complaint  Patient presents with  . Follow-up  . Depression, major, recurrent, in partial remission ([place])  . Stress  . Anxiety    Depression        Associated symptoms include fatigue.  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  Last visit was December 2020.  he was doing well.  No meds were changed.  He was taking duloxetine 90 mg, Depakote ER 1500 mg, naltrexone, and stopped Campral.  04/26/20 appt with following noted:  Seen with wife. Both hospitalized with Covid in December.   Fully recovered.  Vaccinated. Work up for fatigue testosterone started.  Not in normal range yet. Some ups and downs lately good and bad without reason. Wife noticing more depression with negative thoughts and dwelling on death.  But mood swings are under control.  No SE he notices.  Wife sees more scratching of himself but he says it's a chronic problem. Really busy working 2 jobs.    Patient reports stable mood and denies irritable moods. He agrees he's more negative and down.   Patient denies any recent difficulty with anxiety.  Patient has occ difficulty with sleep initiation or maintenance. Denies appetite disturbance.  Patient reports that energy and motivation have been good.  Patient denies any difficulty with concentration.  Patient denies any suicidal ideation.  Sometimes crazy dreams. Plan: For depression increase duloxetine to 120 mg daily.  06/28/20 appt with the following noted: Not taking naltrexone he thinks. Is taking duloxetine 120 mg daily and Depakote 1500 mg daily. Working 2 jobs. Helped with increase Duloxetine.  Less tense, more level.  Less down and less negative thinking as often.  Can find some things he enjoys.  Walks with Chad Reynolds.  Working on  losing weight.  Today's F's 60 yo today. Some sleep issues DT nocturia.  02/07/21 appt noted: Jan March higher stress with business and personal.  Wife's family member died suicide.  Another family member died cancer.  F health issues and hosp lately in rehab.  M bad tremors.   Affected him with more down and anxiety, esp more down.  Wonders about increasing Cymbalta.  Handles stress better than the past and not abusing alcohol like in the past.  On duloxetine 120 and Depakote 1500.    Occ beer without excess now.  Minimal alcohol.  No longer hangs out with friends who drink to excess.  Past Psychiatric Medication Trials: Duloxetine 120, Lexapro, Viibryd,  Depakote 1500  Review of Systems:  Review of Systems  Constitutional: Positive for fatigue.  Neurological: Negative for tremors and weakness.  Psychiatric/Behavioral: Positive for depression.    Medications: I have reviewed the patient's current medications.  Current Outpatient Medications  Medication Sig Dispense Refill  . Azilsartan-Chlorthalidone (EDARBYCLOR) 40-12.5 MG TABS Take by mouth.    . divalproex (DEPAKOTE ER) 500 MG 24 hr tablet Take 3 tablets (1,500 mg total) by mouth daily. 270 tablet 0  . DULoxetine (CYMBALTA) 60 MG capsule TAKE (2) CAPSULES DAILY. 60 capsule 0  . naltrexone (DEPADE) 50 MG tablet TAKE ONE TABLET AT BEDTIME. 30 tablet 2  . rosuvastatin (CRESTOR) 20 MG tablet Take 20 mg by mouth daily.    . TESTIM 50 MG/5GM (1%) GEL 5 g daily. (Patient not taking:  Reported on 02/07/2021)     No current facility-administered medications for this visit.    Medication Side Effects: None  Allergies: No Known Allergies  Past Medical History:  Diagnosis Date  . Anxiety   . Hypertension     Family History  Problem Relation Age of Onset  . Tremor Mother   . Diabetes Mother   . Diabetes Father   . Atrial fibrillation Father   . Heart disease Father   . Lymphoma Father   . Tremor Maternal Grandmother      Social History   Socioeconomic History  . Marital status: Unknown    Spouse name: Not on file  . Number of children: 2  . Years of education: BA  . Highest education level: Not on file  Occupational History  . Occupation: Engineer, drilling  Tobacco Use  . Smoking status: Never Smoker  . Smokeless tobacco: Never Used  Vaping Use  . Vaping Use: Never used  Substance and Sexual Activity  . Alcohol use: Yes  . Drug use: No  . Sexual activity: Not on file  Other Topics Concern  . Not on file  Social History Narrative   Lives with wife   Caffeine use: daily   Right handed    Social Determinants of Health   Financial Resource Strain: Not on file  Food Insecurity: Not on file  Transportation Needs: Not on file  Physical Activity: Not on file  Stress: Not on file  Social Connections: Not on file  Intimate Partner Violence: Not on file    Past Medical History, Surgical history, Social history, and Family history were reviewed and updated as appropriate.   Please see review of systems for further details on the patient's review from today.   Objective:   Physical Exam:  There were no vitals taken for this visit.  Physical Exam Constitutional:      General: He is not in acute distress.    Appearance: He is well-developed.  Musculoskeletal:        General: No deformity.  Neurological:     Mental Status: He is alert and oriented to person, place, and time.     Coordination: Coordination normal.  Psychiatric:        Attention and Perception: He is attentive. He does not perceive auditory hallucinations.        Mood and Affect: Mood is not anxious or depressed. Affect is not labile, blunt, angry or inappropriate.        Speech: Speech normal.        Behavior: Behavior normal.        Thought Content: Thought content normal. Thought content does not include homicidal or suicidal ideation. Thought content does not include homicidal or suicidal plan.        Cognition and  Memory: Cognition normal.        Judgment: Judgment normal.     Comments: Insight intact. No auditory or visual hallucinations. No delusions.      Lab Review:     Component Value Date/Time   NA 140 12/14/2019 0815   K 3.7 12/14/2019 0815   CL 100 12/14/2019 0815   CO2 30 12/14/2019 0815   GLUCOSE 99 12/14/2019 0815   BUN 18 12/14/2019 0815   CREATININE 1.00 12/14/2019 0815   CALCIUM 9.2 12/14/2019 0815   PROT 7.0 12/14/2019 0815   ALBUMIN 4.1 12/14/2019 0815   AST 16 12/14/2019 0815   ALT 20 12/14/2019 0815   ALKPHOS 45 12/14/2019 0815  BILITOT 0.4 12/14/2019 0815   GFRNONAA >60 12/14/2019 0815   GFRAA >60 12/14/2019 0815       Component Value Date/Time   WBC 6.4 12/14/2019 0815   WBC 7.1 10/13/2019 1623   RBC 4.58 12/14/2019 0815   RBC 4.56 12/14/2019 0815   HGB 14.0 12/14/2019 0815   HCT 40.6 12/14/2019 0815   PLT 242 12/14/2019 0815   MCV 88.6 12/14/2019 0815   MCH 30.6 12/14/2019 0815   MCHC 34.5 12/14/2019 0815   RDW 12.1 12/14/2019 0815   LYMPHSABS 2.7 12/14/2019 0815   MONOABS 0.5 12/14/2019 0815   EOSABS 0.2 12/14/2019 0815   BASOSABS 0.1 12/14/2019 0815    No results found for: POCLITH, LITHIUM   No results found for: PHENYTOIN, PHENOBARB, VALPROATE, CBMZ   .res Assessment: Plan:    Clee was seen today for follow-up, depression, major, recurrent, in partial remission (hcc), stress and anxiety.  Diagnoses and all orders for this visit:  Depression, major, recurrent, in partial remission ([place])  Generalized anxiety disorder  Alcohol dependence in remission [facility])   Chad Reynolds has had a good response to a combination of duloxetine plus Depakote.  Depakote was added for irritability which was not adequately addressed by the duloxetine.  His depression is again under control.  His irritability is under control.  His wife has had a lot of input and she agrees with a good response.  For depression increase duloxetine to 120 mg daily was helpful at the  time Continue Depakote 1500 mg daily for irritability Abilify potentiation 2.5 mg AM for 1 week and if no better then 5 mg AM  Chad Reynolds is also been able to greatly reduce his alcohol intake and appears to no longer be abusing alcohol.  His wife would like him to completely stop which she has not done but he is able to go for days sometimes weeks at a time with no alcohol.  Consider replacing Depakote with Abilify Discussed potential metabolic side effects associated with atypical antipsychotics, as well as potential risk for movement side effects. Advised pt to contact office if movement side effects occur.   Follow-up 2 mos  Lynder Parents MD, DFAPA  Please see After Visit Summary for patient specific instructions.  No future appointments.  No orders of the defined types were placed in this encounter.   -------------------------------

## 2021-02-08 ENCOUNTER — Other Ambulatory Visit: Payer: Self-pay | Admitting: Psychiatry

## 2021-02-08 DIAGNOSIS — F3341 Major depressive disorder, recurrent, in partial remission: Secondary | ICD-10-CM

## 2021-02-08 DIAGNOSIS — F1021 Alcohol dependence, in remission: Secondary | ICD-10-CM

## 2021-02-11 NOTE — Telephone Encounter (Signed)
Pt called checking on the status of his refill

## 2021-03-03 ENCOUNTER — Emergency Department (HOSPITAL_BASED_OUTPATIENT_CLINIC_OR_DEPARTMENT_OTHER)
Admission: EM | Admit: 2021-03-03 | Discharge: 2021-03-03 | Disposition: A | Discharge status: Home / Self Care | Payer: 59 | Attending: Emergency Medicine | Admitting: Emergency Medicine

## 2021-03-03 ENCOUNTER — Other Ambulatory Visit: Payer: Self-pay

## 2021-03-03 ENCOUNTER — Emergency Department (HOSPITAL_BASED_OUTPATIENT_CLINIC_OR_DEPARTMENT_OTHER): Payer: 59

## 2021-03-03 DIAGNOSIS — I10 Essential (primary) hypertension: Secondary | ICD-10-CM | POA: Insufficient documentation

## 2021-03-03 DIAGNOSIS — Z8616 Personal history of COVID-19: Secondary | ICD-10-CM | POA: Insufficient documentation

## 2021-03-03 DIAGNOSIS — L03032 Cellulitis of left toe: Secondary | ICD-10-CM | POA: Insufficient documentation

## 2021-03-03 DIAGNOSIS — R2242 Localized swelling, mass and lump, left lower limb: Secondary | ICD-10-CM | POA: Diagnosis present

## 2021-03-03 DIAGNOSIS — Z79899 Other long term (current) drug therapy: Secondary | ICD-10-CM | POA: Insufficient documentation

## 2021-03-03 LAB — CBC WITH DIFFERENTIAL/PLATELET
Abs Immature Granulocytes: 0.02 10*3/uL (ref 0.00–0.07)
Basophils Absolute: 0.1 10*3/uL (ref 0.0–0.1)
Basophils Relative: 1 %
Eosinophils Absolute: 0.2 10*3/uL (ref 0.0–0.5)
Eosinophils Relative: 3 %
HCT: 39.4 % (ref 39.0–52.0)
Hemoglobin: 13.8 g/dL (ref 13.0–17.0)
Immature Granulocytes: 0 %
Lymphocytes Relative: 38 %
Lymphs Abs: 3.2 10*3/uL (ref 0.7–4.0)
MCH: 31 pg (ref 26.0–34.0)
MCHC: 35 g/dL (ref 30.0–36.0)
MCV: 88.5 fL (ref 80.0–100.0)
Monocytes Absolute: 0.7 10*3/uL (ref 0.1–1.0)
Monocytes Relative: 9 %
Neutro Abs: 4.1 10*3/uL (ref 1.7–7.7)
Neutrophils Relative %: 49 %
Platelets: 209 10*3/uL (ref 150–400)
RBC: 4.45 MIL/uL (ref 4.22–5.81)
RDW: 11.8 % (ref 11.5–15.5)
WBC: 8.3 10*3/uL (ref 4.0–10.5)
nRBC: 0 % (ref 0.0–0.2)

## 2021-03-03 MED ORDER — DOXYCYCLINE HYCLATE 100 MG PO TABS
100.0000 mg | ORAL_TABLET | Freq: Once | ORAL | Status: AC
  Administered 2021-03-03: 100 mg via ORAL
  Filled 2021-03-03: qty 1

## 2021-03-03 MED ORDER — DOXYCYCLINE HYCLATE 100 MG PO CAPS: 100.0000 mg | ORAL_CAPSULE | Freq: Two times a day (BID) | ORAL | 0 refills | Status: DC

## 2021-03-03 NOTE — ED Provider Notes (Signed)
[facility] EMERGENCY DEPT Provider Note   CSN: [id number] Arrival date & time: 03/03/21  1840     History Chief Complaint  Patient presents with  . Foot Pain    Left third toe swollen    Chad Reynolds is a 60 y.o. male.  He has had redness and swelling of the left third toe.  No injury.  He does have a history of cellulitis in the past.  The history is provided by the patient.  Foot Pain This is a new problem. The current episode started more than 2 days ago. The problem occurs constantly. The problem has been gradually worsening. Pertinent negatives include no chest pain, no abdominal pain, no headaches and no shortness of breath. Nothing aggravates the symptoms. Nothing relieves the symptoms. Treatments tried: ice. The treatment provided no relief.       Past Medical History:  Diagnosis Date  . Anxiety   . Hypertension     Patient Active Problem List   Diagnosis Date Noted  . COVID-19 virus infection 10/14/2019  . Hypotension 10/14/2019  . Orthostatic hypotension 10/14/2019  . Bipolar 1 disorder, depressed ([place]) 10/13/2019  . EtOH dependence ([place]) 11/04/2018    Past Surgical History:  Procedure Laterality Date  . CATARACT EXTRACTION Bilateral        Family History  Problem Relation Age of Onset  . Tremor Mother   . Diabetes Mother   . Diabetes Father   . Atrial fibrillation Father   . Heart disease Father   . Lymphoma Father   . Tremor Maternal Grandmother     Social History   Tobacco Use  . Smoking status: Never Smoker  . Smokeless tobacco: Never Used  Vaping Use  . Vaping Use: Never used  Substance Use Topics  . Alcohol use: Yes  . Drug use: No    Home Medications Prior to Admission medications   Medication Sig Start Date End Date Taking? Authorizing Provider  ARIPiprazole (ABILIFY) 5 MG tablet 1/2 tablet daily for 1 week, then 1 daily 02/07/21  Yes Cottle, Billey Co., MD  Azilsartan-Chlorthalidone (EDARBYCLOR) 40-12.5 MG  TABS Take by mouth.   Yes [provider]  divalproex (DEPAKOTE ER) 500 MG 24 hr tablet Take 3 tablets (1,500 mg total) by mouth daily. 06/28/20  Yes Cottle, Billey Co., MD  DULoxetine (CYMBALTA) 60 MG capsule TAKE TWO CAPSULES BY MOUTH DAILY. 02/11/21  Yes Cottle, Billey Co., MD  naltrexone (DEPADE) 50 MG tablet TAKE ONE TABLET AT BEDTIME. 02/11/21  Yes Cottle, Billey Co., MD  rosuvastatin (CRESTOR) 20 MG tablet Take 20 mg by mouth daily.   Yes [provider]  TESTIM 50 MG/5GM (1%) GEL 5 g daily. Patient not taking: No sig reported 11/20/19   [provider]    Allergies    Patient has no known allergies.  Review of Systems   Review of Systems  Constitutional: Negative for chills and fever.  HENT: Negative for ear pain and sore throat.   Eyes: Negative for pain and visual disturbance.  Respiratory: Negative for cough and shortness of breath.   Cardiovascular: Negative for chest pain and palpitations.  Gastrointestinal: Negative for abdominal pain and vomiting.  Genitourinary: Negative for dysuria and hematuria.  Musculoskeletal: Negative for arthralgias and back pain.  Skin: Negative for color change and rash.  Neurological: Negative for seizures, syncope and headaches.  All other systems reviewed and are negative.   Physical Exam Updated Vital Signs BP 130/79 (BP  Location: Right Arm)   Pulse 88   Temp 98.1 F (36.7 C) (Oral)   Ht 5\' 9"  (1.753 m)   Wt 99.3 kg   SpO2 96%   BMI 32.34 kg/m   Physical Exam Vitals and nursing note reviewed.  Constitutional:      Appearance: Normal appearance.  HENT:     Head: Normocephalic and atraumatic.  Eyes:     Conjunctiva/sclera: Conjunctivae normal.  Pulmonary:     Effort: Pulmonary effort is normal. No respiratory distress.  Musculoskeletal:        General: No deformity. Normal range of motion.     Cervical back: Normal range of motion.     Comments: The left third toe is diffusely swollen and  erythematous.  There is a little bit of erythema extending over the dorsum of the foot over the metatarsal heads.  Distal pulses are full and symmetric.  Skin:    General: Skin is warm and dry.     Capillary Refill: Capillary refill takes less than 2 seconds.  Neurological:     General: No focal deficit present.     Mental Status: He is alert and oriented to person, place, and time. Mental status is at baseline.  Psychiatric:        Mood and Affect: Mood normal.     ED Results / Procedures / Treatments   Labs (all labs ordered are listed, but only abnormal results are displayed) Labs Reviewed  CBC WITH DIFFERENTIAL/PLATELET    EKG None  Radiology DG Toe 3rd Left  Result Date: 03/03/2021 CLINICAL DATA:  Swelling of the third toe. EXAM: LEFT THIRD TOE COMPARISON:  None. FINDINGS: Soft tissue swelling. Lucency is seed in the lateral aspect of the middle phalanx. No fractures. No foreign body. No other abnormalities. IMPRESSION: Oval lucency in the lateral aspect of the middle third phalanx. This is favored to represent a bone cyst or other nonacute process. Bony erosion from osteomyelitis is not completely excluded but considered less likely. Electronically Signed   By: Dorise Bullion III M.D   On: 03/03/2021 20:10    Procedures Procedures   Medications Ordered in ED Medications - No data to display  ED Course  I have reviewed the triage vital signs and the nursing notes.  Pertinent labs & imaging results that were available during my care of the patient were reviewed by me and considered in my medical decision making (see chart for details).    MDM Rules/Calculators/A&P                          Chad Reynolds presents with redness and swelling of his toe.  He is not systemically ill, and his vital signs are normal.  CBC is also reassuring.  I will start him on doxycycline.  He was advised of his x-ray findings and encouraged to follow-up with his primary care doctor.  I have  a low suspicion for osteomyelitis at this point. Final Clinical Impression(s) / ED Diagnoses Final diagnoses:  Cellulitis of toe of left foot    Rx / DC Orders ED Discharge Orders         Ordered    doxycycline (VIBRAMYCIN) 100 MG capsule  2 times daily        03/03/21 2018           Arnaldo Natal, MD 03/03/21 2020

## 2021-03-03 NOTE — Discharge Instructions (Addendum)
There was a small lucency or light area in the middle part of your toe.  This is likely a benign cyst, but you should make sure to follow-up with your doctor in order to make sure that everything has healed.

## 2021-03-03 NOTE — ED Triage Notes (Signed)
Pt to ED from home with c/o left foot pain after pt noticed that his left 3rd toe was red and swollen. Pt left ankle as well as foot has red splotching and is warm to the touch. Pt states he took a diuretic he was prescribed to help with the swelling. Pt edema on left foot is 1+ with pulses even bilaterally.

## 2021-03-07 ENCOUNTER — Other Ambulatory Visit: Payer: Self-pay

## 2021-03-07 ENCOUNTER — Ambulatory Visit (INDEPENDENT_AMBULATORY_CARE_PROVIDER_SITE_OTHER): Payer: 59

## 2021-03-07 ENCOUNTER — Other Ambulatory Visit: Payer: Self-pay | Admitting: Podiatry

## 2021-03-07 ENCOUNTER — Encounter: Payer: Self-pay | Admitting: Podiatry

## 2021-03-07 ENCOUNTER — Ambulatory Visit: Payer: 59 | Admitting: Podiatry

## 2021-03-07 DIAGNOSIS — R600 Localized edema: Secondary | ICD-10-CM

## 2021-03-07 DIAGNOSIS — N529 Male erectile dysfunction, unspecified: Secondary | ICD-10-CM

## 2021-03-07 DIAGNOSIS — L02612 Cutaneous abscess of left foot: Secondary | ICD-10-CM | POA: Diagnosis not present

## 2021-03-07 DIAGNOSIS — M109 Gout, unspecified: Secondary | ICD-10-CM

## 2021-03-07 DIAGNOSIS — L03032 Cellulitis of left toe: Secondary | ICD-10-CM | POA: Diagnosis not present

## 2021-03-07 DIAGNOSIS — I10 Essential (primary) hypertension: Secondary | ICD-10-CM | POA: Insufficient documentation

## 2021-03-07 DIAGNOSIS — I2510 Atherosclerotic heart disease of native coronary artery without angina pectoris: Secondary | ICD-10-CM | POA: Insufficient documentation

## 2021-03-07 DIAGNOSIS — R7303 Prediabetes: Secondary | ICD-10-CM

## 2021-03-07 DIAGNOSIS — R351 Nocturia: Secondary | ICD-10-CM

## 2021-03-07 DIAGNOSIS — L309 Dermatitis, unspecified: Secondary | ICD-10-CM

## 2021-03-07 DIAGNOSIS — F339 Major depressive disorder, recurrent, unspecified: Secondary | ICD-10-CM | POA: Insufficient documentation

## 2021-03-07 DIAGNOSIS — Z5181 Encounter for therapeutic drug level monitoring: Secondary | ICD-10-CM

## 2021-03-07 DIAGNOSIS — R0600 Dyspnea, unspecified: Secondary | ICD-10-CM | POA: Insufficient documentation

## 2021-03-07 DIAGNOSIS — R259 Unspecified abnormal involuntary movements: Secondary | ICD-10-CM

## 2021-03-07 DIAGNOSIS — M79671 Pain in right foot: Secondary | ICD-10-CM | POA: Diagnosis not present

## 2021-03-07 DIAGNOSIS — F411 Generalized anxiety disorder: Secondary | ICD-10-CM | POA: Insufficient documentation

## 2021-03-07 DIAGNOSIS — B353 Tinea pedis: Secondary | ICD-10-CM | POA: Insufficient documentation

## 2021-03-07 DIAGNOSIS — L853 Xerosis cutis: Secondary | ICD-10-CM | POA: Insufficient documentation

## 2021-03-07 DIAGNOSIS — Z8619 Personal history of other infectious and parasitic diseases: Secondary | ICD-10-CM

## 2021-03-07 DIAGNOSIS — E291 Testicular hypofunction: Secondary | ICD-10-CM

## 2021-03-07 DIAGNOSIS — E7800 Pure hypercholesterolemia, unspecified: Secondary | ICD-10-CM | POA: Insufficient documentation

## 2021-03-07 DIAGNOSIS — R0609 Other forms of dyspnea: Secondary | ICD-10-CM

## 2021-03-07 DIAGNOSIS — Z6833 Body mass index (BMI) 33.0-33.9, adult: Secondary | ICD-10-CM

## 2021-03-07 DIAGNOSIS — N401 Enlarged prostate with lower urinary tract symptoms: Secondary | ICD-10-CM | POA: Insufficient documentation

## 2021-03-07 DIAGNOSIS — Z86010 Personal history of colonic polyps: Secondary | ICD-10-CM

## 2021-03-07 DIAGNOSIS — Z860101 Personal history of adenomatous and serrated colon polyps: Secondary | ICD-10-CM | POA: Insufficient documentation

## 2021-03-07 DIAGNOSIS — Z87898 Personal history of other specified conditions: Secondary | ICD-10-CM | POA: Insufficient documentation

## 2021-03-07 DIAGNOSIS — E237 Disorder of pituitary gland, unspecified: Secondary | ICD-10-CM

## 2021-03-07 DIAGNOSIS — D649 Anemia, unspecified: Secondary | ICD-10-CM | POA: Insufficient documentation

## 2021-03-07 HISTORY — DX: Other forms of dyspnea: R06.09

## 2021-03-07 HISTORY — DX: Personal history of colonic polyps: Z86.010

## 2021-03-07 HISTORY — DX: Major depressive disorder, recurrent, unspecified: F33.9

## 2021-03-07 HISTORY — DX: Male erectile dysfunction, unspecified: N52.9

## 2021-03-07 HISTORY — DX: Personal history of adenomatous and serrated colon polyps: Z86.0101

## 2021-03-07 HISTORY — DX: Localized edema: R60.0

## 2021-03-07 HISTORY — DX: Xerosis cutis: L85.3

## 2021-03-07 HISTORY — DX: Body mass index (BMI) 33.0-33.9, adult: Z68.33

## 2021-03-07 HISTORY — DX: Anemia, unspecified: D64.9

## 2021-03-07 HISTORY — DX: Dermatitis, unspecified: L30.9

## 2021-03-07 HISTORY — DX: Dyspnea, unspecified: R06.00

## 2021-03-07 HISTORY — DX: Personal history of other specified conditions: Z87.898

## 2021-03-07 HISTORY — DX: Generalized anxiety disorder: F41.1

## 2021-03-07 HISTORY — DX: Prediabetes: R73.03

## 2021-03-07 HISTORY — DX: Benign prostatic hyperplasia with lower urinary tract symptoms: N40.1

## 2021-03-07 HISTORY — DX: Encounter for therapeutic drug level monitoring: Z51.81

## 2021-03-07 HISTORY — DX: Disorder of pituitary gland, unspecified: E23.7

## 2021-03-07 HISTORY — DX: Nocturia: R35.1

## 2021-03-07 HISTORY — DX: Personal history of other infectious and parasitic diseases: Z86.19

## 2021-03-07 HISTORY — DX: Testicular hypofunction: E29.1

## 2021-03-07 HISTORY — DX: Unspecified abnormal involuntary movements: R25.9

## 2021-03-07 HISTORY — DX: Atherosclerotic heart disease of native coronary artery without angina pectoris: I25.10

## 2021-03-07 HISTORY — DX: Tinea pedis: B35.3

## 2021-03-07 NOTE — Progress Notes (Signed)
Subjective:   Patient ID: Chad Reynolds, male   DOB: 60 y.o.   MRN: [id number]   HPI Patient presents with redness of the left third digit stating its been there for around 10 days and he went to urgent care who put him on antibiotic and has not seen significant improvement.  Patient states its been quite sore and he does not remember injury that occurred.  Patient does not smoke likes to be active   Review of Systems  All other systems reviewed and are negative.       Objective:  Physical Exam Vitals and nursing note reviewed.  Constitutional:      Appearance: He is well-developed.  Pulmonary:     Effort: Pulmonary effort is normal.  Musculoskeletal:        General: Normal range of motion.  Skin:    General: Skin is warm.  Neurological:     Mental Status: He is alert.     Neurovascular status found to be intact muscle strength was found to be adequate range of motion adequate.  Patient is found to have redness of the distal interphalangeal joint digit 3 left and has moderate proximal inflammation around the MPJ no drainage noted no breaks in skin noted.  Patient is found to have good digital perfusion well oriented x3     Assessment:  Difficult to make determination between possibility of infection versus a inflammation which may be systemic or possible gout.     Plan:  H&P explained to him the conditions reviewed x-rays.  At this point I am sending for arthritic profile along with CBC with differential and we will continue his antibiotics and may change depending on what we see.  I did try to educate him on this and the difficulty of making determination between these and I explained if he should develop any systemic signs of infection I want him to go straight to the emergency room  X-rays were negative for signs of osteolysis or indications of bony pathology

## 2021-03-07 NOTE — Progress Notes (Signed)
a 

## 2021-03-09 LAB — RHEUMATOID ARTHRITIS PROFILE
Cyclic Citrullin Peptide Ab: 5 units (ref 0–19)
Rheumatoid fact SerPl-aCnc: <10 IU/mL (ref <14.0)

## 2021-03-11 ENCOUNTER — Ambulatory Visit: Payer: 59 | Admitting: Podiatry

## 2021-03-13 ENCOUNTER — Encounter: Payer: Self-pay | Admitting: Podiatry

## 2021-03-13 ENCOUNTER — Other Ambulatory Visit: Payer: Self-pay

## 2021-03-13 ENCOUNTER — Ambulatory Visit: Payer: 59 | Admitting: Podiatry

## 2021-03-13 DIAGNOSIS — M109 Gout, unspecified: Secondary | ICD-10-CM | POA: Diagnosis not present

## 2021-03-13 DIAGNOSIS — L03032 Cellulitis of left toe: Secondary | ICD-10-CM

## 2021-03-13 DIAGNOSIS — L02612 Cutaneous abscess of left foot: Secondary | ICD-10-CM

## 2021-03-13 NOTE — Progress Notes (Signed)
Subjective:   Patient ID: Chad Reynolds, male   DOB: 60 y.o.   MRN: [id number]   HPI Patient presents stating my left third toe is somewhat improved still little bit red and mildly swollen and I have not had any other pathology   ROS      Objective:  Physical Exam  Neurovascular status intact with redness of the third digit left distal localized with mild swelling occurring that does not appear to spread proximal with no indications of pathology does have some swelling in the lower legs bilateral     Assessment:  Difficult to ascertain between inflammatory versus infective condition but no breakdown in tissue no drainage and reduced redness leads me more towards inflammation.     Plan:  H&P reviewed condition recommended continued soaks wide shoes and since it is improving we will stop the antibiotic and see the response.  Encouraged him to call with questions concerns which may arise continue to check temperature daily

## 2021-04-02 ENCOUNTER — Other Ambulatory Visit: Payer: Self-pay | Admitting: Psychiatry

## 2021-04-02 DIAGNOSIS — F3341 Major depressive disorder, recurrent, in partial remission: Secondary | ICD-10-CM

## 2021-04-11 ENCOUNTER — Ambulatory Visit (INDEPENDENT_AMBULATORY_CARE_PROVIDER_SITE_OTHER): Payer: 59 | Admitting: Psychiatry

## 2021-04-11 ENCOUNTER — Encounter: Payer: Self-pay | Admitting: Psychiatry

## 2021-04-11 ENCOUNTER — Other Ambulatory Visit: Payer: Self-pay

## 2021-04-11 DIAGNOSIS — F411 Generalized anxiety disorder: Secondary | ICD-10-CM

## 2021-04-11 DIAGNOSIS — F3341 Major depressive disorder, recurrent, in partial remission: Secondary | ICD-10-CM | POA: Diagnosis not present

## 2021-04-11 DIAGNOSIS — F1021 Alcohol dependence, in remission: Secondary | ICD-10-CM | POA: Diagnosis not present

## 2021-04-11 MED ORDER — DULOXETINE HCL 60 MG PO CPEP: 120.0000 mg | ORAL_CAPSULE | Freq: Every day | ORAL | 1 refills | Status: DC

## 2021-04-11 MED ORDER — ARIPIPRAZOLE 5 MG PO TABS: 5.0000 mg | ORAL_TABLET | Freq: Every day | ORAL | 1 refills | Status: DC

## 2021-04-11 MED ORDER — NALTREXONE HCL 50 MG PO TABS: 50.0000 mg | ORAL_TABLET | Freq: Every day | ORAL | 1 refills | Status: DC

## 2021-04-11 MED ORDER — DIVALPROEX SODIUM ER 500 MG PO TB24: 1500.0000 mg | ORAL_TABLET | Freq: Every day | ORAL | 1 refills | Status: DC

## 2021-04-11 NOTE — Progress Notes (Signed)
Chad Reynolds [id number] 18-Sep-1961 60 y.o.  Subjective:   Patient ID:  Chad Reynolds is a 60 y.o. (DOB December 03, 1960) male.  Chief Complaint:  Chief Complaint  Patient presents with  . Follow-up  . Depression  . Anxiety    Depression        Associated symptoms include fatigue.  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  Last visit was December 2020.  he was doing well.  No meds were changed.  He was taking duloxetine 90 mg, Depakote ER 1500 mg, naltrexone, and stopped Campral.  04/26/20 appt with following noted:  Seen with wife. Both hospitalized with Covid in December.   Fully recovered.  Vaccinated. Work up for fatigue testosterone started.  Not in normal range yet. Some ups and downs lately good and bad without reason. Wife noticing more depression with negative thoughts and dwelling on death.  But mood swings are under control.  No SE he notices.  Wife sees more scratching of himself but he says it's a chronic problem. Really busy working 2 jobs.    Patient reports stable mood and denies irritable moods. He agrees he's more negative and down.   Patient denies any recent difficulty with anxiety.  Patient has occ difficulty with sleep initiation or maintenance. Denies appetite disturbance.  Patient reports that energy and motivation have been good.  Patient denies any difficulty with concentration.  Patient denies any suicidal ideation.  Sometimes crazy dreams. Plan: For depression increase duloxetine to 120 mg daily.  06/28/20 appt with the following noted: Not taking naltrexone he thinks. Is taking duloxetine 120 mg daily and Depakote 1500 mg daily. Working 2 jobs. Helped with increase Duloxetine.  Less tense, more level.  Less down and less negative thinking as often.  Can find some things he enjoys.  Walks with [name].  Working on losing weight.  Today's F's 59 yo today. Some sleep  issues DT nocturia.  02/07/21 appt noted: Jan March higher stress with business and personal.  Wife's family member died suicide.  Another family member died cancer.  F health issues and hosp lately in rehab.  M bad tremors.   Affected him with more down and anxiety, esp more down.  Wonders about increasing Cymbalta.  Handles stress better than the past and not abusing alcohol like in the past.  On duloxetine 120 and Depakote 1500.   Plan: For depression increase duloxetine to 120 mg daily was helpful at the time Continue Depakote 1500 mg daily for irritability Abilify potentiation 2.5 mg AM for 1 week and if no better then 5 mg AM   04/12/2021 appt noted: F died 3 weeks ago. Abilify helped depression and wife noticed.  More mellow and less stressed. Increased to 5 mg without SE. Grief.  Anxiety now more than depression but manageable.  Not overly anxious.  Depakote and Abilify seemed to help the most. Doing well with alcohol. Occ beer without excess now.  Minimal alcohol.  No longer hangs out with friends who drink to excess.  Past Psychiatric Medication Trials: Duloxetine 120, Lexapro, Viibryd,  Depakote 1500  Review of Systems:  Review of Systems  Constitutional: Positive for fatigue and unexpected weight change.  Neurological: Negative for tremors and weakness.  Psychiatric/Behavioral: Positive for depression.    Medications: I have reviewed the patient's current medications.  Current Outpatient Medications  Medication Sig Dispense Refill  . alfuzosin (UROXATRAL) 10 MG  24 hr tablet Take 10 mg by mouth daily.    Marland Kitchen aspirin 81 MG chewable tablet 1 tablet    . Azilsartan-Chlorthalidone (EDARBYCLOR) 40-12.5 MG TABS Take by mouth.    . botulinum toxin Type A (BOTOX) 100 units SOLR injection Inject into the muscle.    Marland Kitchen doxycycline (VIBRAMYCIN) 100 MG capsule Take 1 capsule (100 mg total) by mouth 2 (two) times daily. 20 capsule 0  . rosuvastatin (CRESTOR) 20 MG tablet Take 20 mg by mouth  daily.    . tadalafil (CIALIS) 20 MG tablet 1 tablet    . TESTIM 50 MG/5GM (1%) GEL 5 g daily.    . ARIPiprazole (ABILIFY) 5 MG tablet Take 1 tablet (5 mg total) by mouth daily. 90 tablet 1  . divalproex (DEPAKOTE ER) 500 MG 24 hr tablet Take 3 tablets (1,500 mg total) by mouth daily. 270 tablet 1  . DULoxetine (CYMBALTA) 60 MG capsule Take 2 capsules (120 mg total) by mouth daily. 180 capsule 1  . naltrexone (DEPADE) 50 MG tablet Take 1 tablet (50 mg total) by mouth at bedtime. 90 tablet 1   No current facility-administered medications for this visit.    Medication Side Effects: None  Allergies: No Known Allergies  Past Medical History:  Diagnosis Date  . Anxiety   . Hypertension     Family History  Problem Relation Age of Onset  . Tremor Mother   . Diabetes Mother   . Diabetes Father   . Atrial fibrillation Father   . Heart disease Father   . Lymphoma Father   . Tremor Maternal Grandmother     Social History   Socioeconomic History  . Marital status: Unknown    Spouse name: Not on file  . Number of children: 2  . Years of education: BA  . Highest education level: Not on file  Occupational History  . Occupation: Engineer, drilling  Tobacco Use  . Smoking status: Never Smoker  . Smokeless tobacco: Never Used  Vaping Use  . Vaping Use: Never used  Substance and Sexual Activity  . Alcohol use: Yes  . Drug use: No  . Sexual activity: Not on file  Other Topics Concern  . Not on file  Social History Narrative   Lives with wife   Caffeine use: daily   Right handed    Social Determinants of Health   Financial Resource Strain: Not on file  Food Insecurity: Not on file  Transportation Needs: Not on file  Physical Activity: Not on file  Stress: Not on file  Social Connections: Not on file  Intimate Partner Violence: Not on file    Past Medical History, Surgical history, Social history, and Family history were reviewed and updated as appropriate.   Please see  review of systems for further details on the patient's review from today.   Objective:   Physical Exam:  There were no vitals taken for this visit.  Physical Exam Constitutional:      General: He is not in acute distress.    Appearance: He is well-developed.  Musculoskeletal:        General: No deformity.  Neurological:     Mental Status: He is alert and oriented to person, place, and time.     Coordination: Coordination normal.  Psychiatric:        Attention and Perception: He is attentive. He does not perceive auditory hallucinations.        Mood and Affect: Mood is anxious. Mood is not  depressed. Affect is not labile, blunt, angry or inappropriate.        Speech: Speech normal.        Behavior: Behavior normal.        Thought Content: Thought content normal. Thought content does not include homicidal or suicidal ideation. Thought content does not include homicidal or suicidal plan.        Cognition and Memory: Cognition normal.        Judgment: Judgment normal.     Comments: Insight intact. No auditory or visual hallucinations. No delusions.  Depression managed.     Lab Review:     Component Value Date/Time   NA 140 12/14/2019 0815   K 3.7 12/14/2019 0815   CL 100 12/14/2019 0815   CO2 30 12/14/2019 0815   GLUCOSE 99 12/14/2019 0815   BUN 18 12/14/2019 0815   CREATININE 1.00 12/14/2019 0815   CALCIUM 9.2 12/14/2019 0815   PROT 7.0 12/14/2019 0815   ALBUMIN 4.1 12/14/2019 0815   AST 16 12/14/2019 0815   ALT 20 12/14/2019 0815   ALKPHOS 45 12/14/2019 0815   BILITOT 0.4 12/14/2019 0815   GFRNONAA >60 12/14/2019 0815   GFRAA >60 12/14/2019 0815       Component Value Date/Time   WBC 8.3 03/03/2021 1925   RBC 4.45 03/03/2021 1925   HGB 13.8 03/03/2021 1925   HGB 14.0 12/14/2019 0815   HCT 39.4 03/03/2021 1925   PLT 209 03/03/2021 1925   PLT 242 12/14/2019 0815   MCV 88.5 03/03/2021 1925   MCH 31.0 03/03/2021 1925   MCHC 35.0 03/03/2021 1925   RDW 11.8  03/03/2021 1925   LYMPHSABS 3.2 03/03/2021 1925   MONOABS 0.7 03/03/2021 1925   EOSABS 0.2 03/03/2021 1925   BASOSABS 0.1 03/03/2021 1925    No results found for: POCLITH, LITHIUM   No results found for: PHENYTOIN, PHENOBARB, VALPROATE, CBMZ   .res Assessment: Plan:    Drue was seen today for follow-up, depression and anxiety.  Diagnoses and all orders for this visit:  Depression, major, recurrent, in partial remission ([place]) -     ARIPiprazole (ABILIFY) 5 MG tablet; Take 1 tablet (5 mg total) by mouth daily. -     divalproex (DEPAKOTE ER) 500 MG 24 hr tablet; Take 3 tablets (1,500 mg total) by mouth daily. -     DULoxetine (CYMBALTA) 60 MG capsule; Take 2 capsules (120 mg total) by mouth daily.  Generalized anxiety disorder  Alcohol dependence in remission ([facility]) -     naltrexone (DEPADE) 50 MG tablet; Take 1 tablet (50 mg total) by mouth at bedtime.   Richardson Landry has had a good response to a combination of duloxetine plus Depakote.  Depakote was added for irritability which was not adequately addressed by the duloxetine.  His depression is again under control.  His irritability is under control.  His wife has had a lot of input and she agrees with a good response.  For depression duloxetine to 120 mg daily was helpful at the time Continue Depakote 1500 mg daily for irritability Benefit so continue Abilify potentiation  5 mg AM   Richardson Landry is also been able to greatly reduce his alcohol intake and appears to no longer be abusing alcohol.  His wife would like him to completely stop which she has not done but he is able to go for days sometimes weeks at a time with no alcohol.  Consider replacing Depakote with Abilify Discussed potential metabolic side effects associated with  atypical antipsychotics, as well as potential risk for movement side effects. Advised pt to contact office if movement side effects occur.  F just died.  Now  Not time for med changes   Follow-up 4 mos  Lynder Parents MD, DFAPA  Please see After Visit Summary for patient specific instructions.  No future appointments.  No orders of the defined types were placed in this encounter.   -------------------------------

## 2021-05-02 ENCOUNTER — Telehealth: Payer: Self-pay | Admitting: *Deleted

## 2021-05-02 NOTE — Telephone Encounter (Signed)
NOTES RECEIVED FROM DR. [facility] AND FILED IN CABINET

## 2021-05-06 ENCOUNTER — Other Ambulatory Visit: Payer: Self-pay

## 2021-05-06 ENCOUNTER — Ambulatory Visit (INDEPENDENT_AMBULATORY_CARE_PROVIDER_SITE_OTHER): Payer: 59 | Admitting: Cardiology

## 2021-05-06 VITALS — BP 91/63 | HR 102 | Ht 69.0 in | Wt 232.1 lb

## 2021-05-06 DIAGNOSIS — I10 Essential (primary) hypertension: Secondary | ICD-10-CM | POA: Insufficient documentation

## 2021-05-06 DIAGNOSIS — F419 Anxiety disorder, unspecified: Secondary | ICD-10-CM | POA: Insufficient documentation

## 2021-05-06 DIAGNOSIS — R7989 Other specified abnormal findings of blood chemistry: Secondary | ICD-10-CM | POA: Insufficient documentation

## 2021-05-06 DIAGNOSIS — I2510 Atherosclerotic heart disease of native coronary artery without angina pectoris: Secondary | ICD-10-CM | POA: Insufficient documentation

## 2021-05-06 DIAGNOSIS — E782 Mixed hyperlipidemia: Secondary | ICD-10-CM

## 2021-05-06 DIAGNOSIS — I890 Lymphedema, not elsewhere classified: Secondary | ICD-10-CM | POA: Insufficient documentation

## 2021-05-06 DIAGNOSIS — E669 Obesity, unspecified: Secondary | ICD-10-CM

## 2021-05-06 DIAGNOSIS — N289 Disorder of kidney and ureter, unspecified: Secondary | ICD-10-CM | POA: Insufficient documentation

## 2021-05-06 DIAGNOSIS — R609 Edema, unspecified: Secondary | ICD-10-CM

## 2021-05-06 HISTORY — DX: Lymphedema, not elsewhere classified: I89.0

## 2021-05-06 HISTORY — DX: Hypercalcemia: E83.52

## 2021-05-06 HISTORY — DX: Other specified abnormal findings of blood chemistry: R79.89

## 2021-05-06 MED ORDER — TORSEMIDE 40 MG PO TABS: 40.0000 mg | ORAL_TABLET | Freq: Every day | ORAL | 3 refills | Status: DC

## 2021-05-06 MED ORDER — AZILSARTAN MEDOXOMIL 40 MG PO TABS: 40.0000 mg | ORAL_TABLET | Freq: Every day | ORAL | 3 refills | Status: DC

## 2021-05-06 NOTE — Patient Instructions (Addendum)
Medication Instructions:  Your physician has recommended you make the following change in your medication:    Stop Azilsartan/chlorathiladone Orthoptist) Stop furosemide Start Azilasartan 40 mg daily Start Torsemide 40 mg daily.  *If you need a refill on your cardiac medications before your next appointment, please call your pharmacy*   Lab Work: Your physician recommends that you return for lab work in: 1 week for a BMET. You will not need an appointment or to fast. We will be closed 05/13/21.  If you have labs (blood work) drawn today and your tests are completely normal, you will receive your results only by: [place] (if you have MyChart) OR A paper copy in the mail If you have any lab test that is abnormal or we need to change your treatment, we will call you to review the results.   Testing/Procedures: None ordered   Follow-Up: At [facility], you and your health needs are our priority.  As part of our continuing mission to provide you with exceptional heart care, we have created designated Provider Care Teams.  These Care Teams include your primary Cardiologist (physician) and Advanced Practice Providers (APPs -  Physician Assistants and Nurse Practitioners) who all work together to provide you with the care you need, when you need it.  We recommend signing up for the patient portal called "MyChart".  Sign up information is provided on this After Visit Summary.  MyChart is used to connect with patients for Virtual Visits (Telemedicine).  Patients are able to view lab/test results, encounter notes, upcoming appointments, etc.  Non-urgent messages can be sent to your provider as well.   To learn more about what you can do with MyChart, go to [email or web address].    Your next appointment:   6 month(s)  The format for your next appointment:   In Person  Provider:   Jyl Heinz, MD   Other Instructions NA

## 2021-05-06 NOTE — Progress Notes (Signed)
Cardiology Office Note:    Date:  05/06/2021   ID:  Chad Reynolds, DOB 1960-11-24, MRN [id number]  PCP:  Deland Pretty, MD  Cardiologist:  Jenean Lindau, MD   Referring MD: Deland Pretty, MD    ASSESSMENT:    1. Atherosclerosis of native coronary artery of native heart without angina pectoris   2. Essential hypertension   3. Mixed dyslipidemia   4. Obesity (BMI 30.0-34.9)   5. Renal insufficiency    PLAN:    In order of problems listed above:  Elevated calcium score: Secondary prevention stressed with the patient.  Importance of compliance with diet medication stressed any vocalized understanding.  Patient is on statin therapy and managed by primary care. Essential hypertension: Blood pressure stable and diet was emphasized.  Lifestyle modification urged. Mixed dyslipidemia: Lipids followed by primary care physician and diet emphasized. Bilateral pedal edema: I am not clear as to what the etiology of this is.  Echocardiogram done recently revealed ejection fraction within normal limits.  His blood pressure is well optimized.  Because of the renal insufficiency I will be following recommendations.  I have explained his ARB and diuretic.  I have discontinued the diuretic chlorthalidone.  I have also discontinued furosemide and initiated him on torsemide 40 mg daily.  Hopefully this will help his pedal edema.  Feet elevation was encouraged.  Compression stockings was encouraged.  Patient and wife had multiple questions which were answered to their satisfaction.  He will be back in a week for follow-up blood pressure and he will bring a log of it.  He will also have a Chem-7. Patient will be seen in follow-up appointment in 3 months or earlier if the patient has any concerns    Medication Adjustments/Labs and Tests Ordered: Current medicines are reviewed at length with the patient today.  Concerns regarding medicines are outlined above.  No orders of the defined types were placed in  this encounter.  No orders of the defined types were placed in this encounter.    History of Present Illness:    Chad Reynolds is a 60 y.o. male who is being seen today for the evaluation of pedal edema at the request of Deland Pretty, MD. patient is a pleasant 60 year old male.  He is accompanied by his wife.  He has history of essential hypertension, dyslipidemia and elevated calcium score.  He is here for evaluation of pedal edema.  He denies any chest pain orthopnea or PND.  He tells me that he walks 1 to 2 miles without any problem.  He has been recently initiated on furosemide and his creatinine is 1.3 according to the labs from his wife on the telephone.  At the time of my evaluation, the patient is alert awake oriented and in no distress.  Also an echocardiogram done a few months ago reveals preserved ejection fraction.  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   Dry skin 03/07/2021   Dyspnea on  exertion 03/07/2021   Eczema 03/07/2021   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   Edema of lower extremity 03/07/2021   Encounter for therapeutic drug level monitoring 03/07/2021   Essential hypertension    Essential tremor 02/12/2018   EtOH dependence ([place]) 11/04/2018   Generalized anxiety disorder 03/07/2021   History of adenomatous polyp of colon 03/07/2021   History of infectious disease 03/07/2021   History of syncope 03/07/2021    Hypercalcemia 05/06/2021   Hypertension    Hypotension 10/14/2019   Lymphedema 05/06/2021   Nocturia 03/07/2021   Orthostatic hypotension 10/14/2019   Other specified abnormal findings of blood chemistry 05/06/2021   Prediabetes 03/07/2021   Pure hypercholesterolemia    Recurrent depression ([place]) 03/07/2021   Tinea pedis 03/07/2021    Past Surgical History:  Procedure Laterality Date   CATARACT EXTRACTION Bilateral     Current Medications: Current Meds  Medication Sig   ARIPiprazole (ABILIFY) 5 MG tablet Take 1 tablet (5 mg total) by mouth daily.   aspirin 81 MG chewable tablet Chew 81 mg by mouth daily.   divalproex (DEPAKOTE ER) 500 MG 24 hr tablet Take 3 tablets (1,500 mg total) by mouth daily.   DULoxetine (CYMBALTA) 60 MG capsule Take 2 capsules (120 mg total) by mouth daily.   furosemide (LASIX) 40 MG tablet Take 40 mg by mouth daily.   naltrexone (DEPADE) 50 MG tablet Take 1 tablet (50 mg total) by mouth at bedtime.   potassium chloride (KLOR-CON) 10 MEQ tablet Take 10 mEq by mouth daily.   tadalafil (CIALIS) 20 MG tablet Take 1 tablet by mouth as needed for erectile dysfunction.   [DISCONTINUED] EDARBYCLOR 40-25 MG TABS Take 1 tablet by mouth daily.     Allergies:   Patient has no known allergies.   Social History   Socioeconomic History   Marital status: Unknown    Spouse name: Not on file   Number of children: 2   Years of education: BA   Highest education level: Not on file  Occupational History   Occupation: [employer]  Tobacco Use   Smoking status: Never   Smokeless tobacco: Never  Vaping Use   Vaping Use: Never used  Substance and Sexual Activity   Alcohol use: Yes   Drug use: No   Sexual activity: Not on file  Other Topics Concern   Not on file  Social History Narrative   Lives with wife   Caffeine use: daily   Right handed    Social Determinants of Health   Financial Resource Strain: Not on file  Food Insecurity: Not on file  Transportation  Needs: Not on file  Physical Activity: Not on file  Stress: Not on file  Social Connections: Not on file     Family History: The patient's family history includes Atrial fibrillation in his father; Diabetes in his father and mother; Heart disease in his father; Lymphoma in his father; Tremor in his maternal grandmother and mother.  ROS:   Please see the history of present illness.    All other systems reviewed and are negative.  EKGs/Labs/Other Studies Reviewed:    The following studies were reviewed today: IMPRESSION: The observed calcium score of 80.7 is at the 70 percentile for subjects of the same age, gender and race/ethnicity who are free of clinical cardiovascular disease and treated diabetes.   Stable chronic elevation of the right hemidiaphragm with right middle lobe/basilar atelectasis or scarring.     Electronically Signed   By:  Rolm Baptise M.D.   On: 01/02/2021 12:37   Recent Labs: 03/03/2021: Hemoglobin 13.8; Platelets 209  Recent Lipid Panel    Component Value Date/Time   TRIG 117 10/13/2019 2355    Physical Exam:    VS:  BP 91/63   Pulse (!) 102   Ht 5\' 9"  (1.753 m)   Wt 232 lb 1.3 oz (105.3 kg)   SpO2 95%   BMI 34.27 kg/m     Wt Readings from Last 3 Encounters:  05/06/21 232 lb 1.3 oz (105.3 kg)  03/03/21 219 lb (99.3 kg)  12/14/19 227 lb 9.6 oz (103.2 kg)     GEN: Patient is in no acute distress HEENT: Normal NECK: No JVD; No carotid bruits LYMPHATICS: No lymphadenopathy CARDIAC: S1 S2 regular, 2/6 systolic murmur at the apex. RESPIRATORY:  Clear to auscultation without rales, wheezing or rhonchi  ABDOMEN: Soft, non-tender, non-distended MUSCULOSKELETAL:  No edema; No deformity  SKIN: Warm and dry NEUROLOGIC:  Alert and oriented x 3 PSYCHIATRIC:  Normal affect    Signed, Jenean Lindau, MD  05/06/2021 4:18 PM    [facility]

## 2021-05-08 ENCOUNTER — Telehealth: Payer: Self-pay | Admitting: Cardiology

## 2021-05-08 NOTE — Telephone Encounter (Signed)
New Message:     Pt would like to switch from Dr Geraldo Pitter to Dr Audie Box please. Pt lives in [place] and his wife is Dr O'Neal's patient. Is this alright with you both?

## 2021-05-09 ENCOUNTER — Other Ambulatory Visit: Payer: Self-pay | Admitting: *Deleted

## 2021-05-09 DIAGNOSIS — R600 Localized edema: Secondary | ICD-10-CM

## 2021-05-14 ENCOUNTER — Encounter: Payer: Self-pay | Admitting: Vascular Surgery

## 2021-05-14 ENCOUNTER — Ambulatory Visit (INDEPENDENT_AMBULATORY_CARE_PROVIDER_SITE_OTHER): Payer: 59 | Admitting: Vascular Surgery

## 2021-05-14 ENCOUNTER — Ambulatory Visit (HOSPITAL_COMMUNITY)
Admission: RE | Admit: 2021-05-14 | Discharge: 2021-05-14 | Disposition: A | Discharge status: Home / Self Care | Payer: 59 | Source: Ambulatory Visit | Attending: Vascular Surgery | Admitting: Vascular Surgery

## 2021-05-14 ENCOUNTER — Other Ambulatory Visit: Payer: Self-pay

## 2021-05-14 VITALS — BP 109/72 | HR 79 | Temp 98.3°F | Resp 20 | Ht 69.0 in | Wt 232.0 lb

## 2021-05-14 DIAGNOSIS — R609 Edema, unspecified: Secondary | ICD-10-CM

## 2021-05-14 DIAGNOSIS — R600 Localized edema: Secondary | ICD-10-CM | POA: Insufficient documentation

## 2021-05-14 DIAGNOSIS — M7989 Other specified soft tissue disorders: Secondary | ICD-10-CM

## 2021-05-14 NOTE — Progress Notes (Signed)
[facility] [place]  ASSESSMENT / PLAN: 60 y.o. male with bilateral pitting edema of lower extremities. No evidence of venous thrombosis or superficial venous reflux on today's duplex ultrasound. No options for intervention to improve his edema. Recommend compression, elevation of the lower extremities. Follow up with me as needed.  CHIEF COMPLAINT: lower extremity swelling  HISTORY OF PRESENT ILLNESS: Chad Reynolds is a 60 y.o. male who presents to clinic for evaluation of bilateral lower extremity edema present since March 2022.  The edema has slowly worsened since onset.  Previously, the edema would be minimally noticeable in the morning and worsen throughout the day.  The edema is present when he wakes.  He has tried diuretic therapy with limited relief.  He has gained weight, but thinks some of it is due to retention of fluid.  Per report, he has had an echocardiogram which was normal, and his renal function is nearly normal.  No history of DVT.  No history of trauma to the lower extremities.  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   Dry skin 03/07/2021   Dyspnea on exertion 03/07/2021   Eczema 03/07/2021   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   Edema of lower extremity 03/07/2021   Encounter for therapeutic drug level monitoring 03/07/2021   Essential hypertension    Essential tremor 02/12/2018   EtOH  dependence ([place]) 11/04/2018   Generalized anxiety disorder 03/07/2021   History of adenomatous polyp of colon 03/07/2021   History of infectious disease 03/07/2021   History of syncope 03/07/2021   Hypercalcemia 05/06/2021   Hypertension    Hypotension 10/14/2019   Lymphedema 05/06/2021   Nocturia 03/07/2021   Orthostatic hypotension 10/14/2019   Other specified abnormal findings of blood chemistry 05/06/2021   Prediabetes 03/07/2021   Pure hypercholesterolemia    Recurrent depression ([place]) 03/07/2021   Tinea pedis 03/07/2021    Past Surgical History:  Procedure Laterality Date   CATARACT EXTRACTION Bilateral     Family History  Problem Relation Age of Onset   Tremor Mother    Diabetes Mother    Diabetes Father    Atrial fibrillation Father    Heart disease Father    Lymphoma Father    Tremor Maternal Grandmother     Social History   Socioeconomic History   Marital status: Married    Spouse name: Not on file   Number of children: 2   Years of education: BA   Highest education level: Not on file  Occupational History   Occupation: [employer]  Tobacco Use   Smoking status: Never   Smokeless tobacco: Never  Vaping Use   Vaping Use: Never used  Substance and Sexual Activity   Alcohol use: Yes   Drug use: No   Sexual activity: Not on file  Other Topics Concern   Not on file  Social History Narrative   Lives with wife   Caffeine use: daily   Right  handed    Social Determinants of Health   Financial Resource Strain: Not on file  Food Insecurity: Not on file  Transportation Needs: Not on file  Physical Activity: Not on file  Stress: Not on file  Social Connections: Not on file  Intimate Partner Violence: Not on file    No Known Allergies  Current Outpatient Medications  Medication Sig Dispense Refill   ARIPiprazole (ABILIFY) 5 MG tablet Take 1 tablet (5 mg total) by mouth daily. 90 tablet 1   aspirin 81 MG chewable tablet Chew 81 mg by mouth daily.      Azilsartan Medoxomil 40 MG TABS Take 40 mg by mouth daily in the afternoon. 90 tablet 3   divalproex (DEPAKOTE ER) 500 MG 24 hr tablet Take 3 tablets (1,500 mg total) by mouth daily. 270 tablet 1   DULoxetine (CYMBALTA) 60 MG capsule Take 2 capsules (120 mg total) by mouth daily. 180 capsule 1   naltrexone (DEPADE) 50 MG tablet Take 1 tablet (50 mg total) by mouth at bedtime. 90 tablet 1   potassium chloride (KLOR-CON) 10 MEQ tablet Take 10 mEq by mouth daily.     tadalafil (CIALIS) 20 MG tablet Take 1 tablet by mouth as needed for erectile dysfunction.     Torsemide 40 MG TABS Take 40 mg by mouth daily in the afternoon. 90 tablet 3   No current facility-administered medications for this visit.    REVIEW OF SYSTEMS:  [X]  denotes positive finding, [ ]  denotes negative finding Cardiac  Comments:  Chest pain or chest pressure:    Shortness of breath upon exertion:    Short of breath when lying flat:    Irregular heart rhythm:        Vascular    Pain in calf, thigh, or hip brought on by ambulation:    Pain in feet at night that wakes you up from your sleep:     Blood clot in your veins:    Leg swelling:  x       Pulmonary    Oxygen at home:    Productive cough:     Wheezing:         Neurologic    Sudden weakness in arms or legs:     Sudden numbness in arms or legs:     Sudden onset of difficulty speaking or slurred speech:    Temporary loss of vision in one eye:     Problems with dizziness:         Gastrointestinal    Blood in stool:     Vomited blood:         Genitourinary    Burning when urinating:     Blood in urine:        Psychiatric    Major depression:         Hematologic    Bleeding problems:    Problems with blood clotting too easily:        Skin    Rashes or ulcers:        Constitutional    Fever or chills:      PHYSICAL EXAM Vitals:   05/14/21 1138  BP: 109/72  Pulse: 79  Resp: 20  Temp: 98.3 F (36.8 C)  SpO2: 97%  Weight: 232 lb (105.2 kg)   Height: 5\' 9"  (1.753 m)    Constitutional: well appearing. no distress. Appears well nourished.  Neurologic: CN intact. no focal findings. no sensory loss. Psychiatric:  Mood and affect  symmetric and appropriate. Eyes:  No icterus. No conjunctival pallor. Ears, nose, throat:  mucous membranes moist. Midline trachea.  Cardiac: regular rate and rhythm.  Respiratory:  unlabored. Abdominal:  soft, non-tender, non-distended.  Peripheral vascular: 2+ pitting edema to the knees bilaterally  No stigmata of chronic venous insufficiency  2+ DP pulses bilaterally Extremity:  non cyanosis. no pallor.  Skin: no gangrene. no ulceration.  Lymphatic: equivocal Stemmer's sign. no palpable lymphadenopathy.  PERTINENT LABORATORY AND RADIOLOGIC DATA  Most recent CBC CBC Latest Ref Rng & Units 03/03/2021 12/14/2019 10/13/2019  WBC 4.0 - 10.5 K/uL 8.3 6.4 7.1  Hemoglobin 13.0 - 17.0 g/dL 13.8 14.0 14.0  Hematocrit 39.0 - 52.0 % 39.4 40.6 37.8(L)  Platelets 150 - 400 K/uL 209 242 234     Most recent CMP CMP Latest Ref Rng & Units 12/14/2019 10/16/2019 10/14/2019  Glucose 70 - 99 mg/dL 99 119(H) 107(H)  BUN 6 - 20 mg/dL 18 13 18   Creatinine 0.61 - 1.24 mg/dL 1.00 0.91 0.76  Sodium 135 - 145 mmol/L 140 141 138  Potassium 3.5 - 5.1 mmol/L 3.7 3.4(L) 3.5  Chloride 98 - 111 mmol/L 100 99 98  CO2 22 - 32 mmol/L 30 31 30   Calcium 8.9 - 10.3 mg/dL 9.2 8.6(L) 8.5(L)  Total Protein 6.5 - 8.1 g/dL 7.0 - -  Total Bilirubin 0.3 - 1.2 mg/dL 0.4 - -  Alkaline Phos 38 - 126 U/L 45 - -  AST 15 - 41 U/L 16 - -  ALT 0 - 44 U/L 20 - -   Venous duplex shows no superficial venous reflux and no DVT bilaterally He does have common femoral vein reflux bilaterally  Yevonne Aline. Stanford Breed, MD [facility] Phone Number: [phone] 05/14/2021 8:56 AM

## 2021-05-17 ENCOUNTER — Telehealth: Payer: Self-pay

## 2021-05-17 NOTE — Telephone Encounter (Signed)
NOTES ON FILE FROM [facility] [phone] REFERRAL SENT TO SCHEDULING

## 2021-05-27 ENCOUNTER — Other Ambulatory Visit: Payer: Self-pay | Admitting: Internal Medicine

## 2021-05-27 DIAGNOSIS — I890 Lymphedema, not elsewhere classified: Secondary | ICD-10-CM

## 2021-06-13 ENCOUNTER — Other Ambulatory Visit: Payer: 59

## 2021-06-14 ENCOUNTER — Other Ambulatory Visit: Payer: 59

## 2021-06-26 ENCOUNTER — Other Ambulatory Visit: Payer: 59

## 2021-08-04 NOTE — Progress Notes (Signed)
Cardiology Office Note:   Date:  08/05/2021  NAME:  Chad Reynolds    MRN: [id number] DOB:  [birth year]   PCP:  Deland Pretty, MD  Cardiologist:  None  Electrophysiologist:  None   Referring MD: Deland Pretty, MD   Chief Complaint  Patient presents with   Coronary Artery Disease    History of Present Illness:   Chad Reynolds is a 60 y.o. male with a hx of CAD, HTN, venous insufficiency who is being seen today for the evaluation of CAD at the request of Deland Pretty, MD. recently underwent calcium scoring.  Found to have elevated score.  Started on aspirin and Crestor by PCP.  Most recent LDL cholesterol 58.  Triglycerides 143.  He does have high blood pressure.  BP is well controlled.  115/74.  He takes azilsartan-chlorthalidone.  He also suffers from chronic lower extremity edema.  Appears to have some venous insufficiency in the common femoral veins.  Symptoms are improved with leg elevation as well as torsemide.  He was cautioned about using torsemide and chlorthalidone as this could cause low potassium.  EKG demonstrates sinus rhythm with no acute ischemic changes.  He reports that he does exercise 3 to 4 days/week.  This includes walking on the treadmill.  No chest pain or shortness of breath reported.  He does not smoke.  He reports he drinks alcohol occasionally.  No illicit drug use is reported.  He is not diabetic.  He has never had a heart attack or stroke.  He does report heart disease in his paternal grandfather.  He otherwise seems to be healthy.  He is obese with a BMI of 34.  Reports he needs to work on diet and exercise more.  Overall doing well denies any symptoms in office.  Problem List CAD -CAC score 80 (70th percentile) -Total cholesterol 137, HDL 54, LDL 58, triglycerides 143 2. HTN 3. Venous insufficiency (CFV reflux)  Past Medical History: Past Medical History:  Diagnosis Date   Abnormal involuntary movement 03/07/2021   Androgen deficiency 03/07/2021   Anemia  03/07/2021   Anxiety    Atherosclerosis of coronary artery 03/07/2021   Atherosclerotic heart disease of native coronary artery without angina pectoris    Benign prostatic hyperplasia with lower urinary tract symptoms 03/07/2021   Bipolar 1 disorder, depressed ([place]) 10/13/2019   Body mass index (BMI) 33.0-33.9, adult 03/07/2021   Coronary atherosclerosis due to calcified coronary lesion    COVID-19 virus infection 10/14/2019   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   Dry skin 03/07/2021   Dyspnea on exertion 03/07/2021   Eczema 03/07/2021   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   Edema of lower extremity 03/07/2021   Encounter for therapeutic drug level monitoring 03/07/2021   Essential hypertension    Essential tremor 02/12/2018   EtOH dependence ([place]) 11/04/2018   Generalized anxiety disorder 03/07/2021   History of adenomatous polyp of colon 03/07/2021   History of infectious disease 03/07/2021   History of syncope 03/07/2021   Hypercalcemia 05/06/2021   Hypertension    Hypotension 10/14/2019   Lymphedema 05/06/2021   Nocturia 03/07/2021   Orthostatic hypotension 10/14/2019   Other specified abnormal findings of blood chemistry 05/06/2021   Prediabetes 03/07/2021   Pure hypercholesterolemia    Recurrent depression ([place]) 03/07/2021   Tinea pedis 03/07/2021    Past Surgical History: Past Surgical History:  Procedure Laterality Date   CATARACT EXTRACTION Bilateral     Current Medications: Current Meds  Medication Sig   ARIPiprazole (ABILIFY) 5 MG tablet Take 1 tablet (5 mg total) by mouth daily.   aspirin 81 MG chewable tablet Chew 81 mg by mouth daily.   Azilsartan-Chlorthalidone (EDARBYCLOR) 40-25 MG TABS Take by mouth daily.   divalproex (DEPAKOTE ER) 500 MG 24 hr tablet Take 3 tablets (1,500 mg total) by mouth daily.   DULoxetine (CYMBALTA) 60 MG capsule Take 2 capsules (120 mg total) by mouth daily.   naltrexone (DEPADE) 50 MG tablet Take 1 tablet (50 mg total) by  mouth at bedtime.   rosuvastatin (CRESTOR) 20 MG tablet Take 20 mg by mouth daily.   torsemide (DEMADEX) 20 MG tablet Take 40 mg by mouth daily.     Allergies:    Patient has no known allergies.   Social History: Social History   Socioeconomic History   Marital status: Married    Spouse name: Not on file   Number of children: 2   Years of education: BA   Highest education level: Not on file  Occupational History   Occupation: [employer]  Tobacco Use   Smoking status: Never   Smokeless tobacco: Never  Vaping Use   Vaping Use: Never used  Substance and Sexual Activity   Alcohol use: Yes   Drug use: No   Sexual activity: Not Currently  Other Topics Concern   Not on file  Social History Narrative   Lives with wife   Caffeine use: daily   Right handed       Works as a Freight forwarder of Radio broadcast assistant Strain: Not on Art therapist Insecurity: Not on file  Transportation Needs: Not on file  Physical Activity: Not on file  Stress: Not on file  Social Connections: Not on file    Family History: The patient's family history includes Atrial fibrillation in his father; Diabetes in his father and mother; Heart disease in his father and paternal grandfather; Lymphoma in his father; Tremor in his maternal grandmother and mother.  ROS:   All other ROS reviewed and negative. Pertinent positives noted in the HPI.     EKGs/Labs/Other Studies Reviewed:   The following studies were personally reviewed by me today:  EKG:  EKG is ordered today.  The ekg ordered today demonstrates normal sinus rhythm heart rate 89, no acute ischemic changes or evidence of infarction, and was personally reviewed by me.   Vasc US 05/14/2021 Summary:  Right:  - No evidence of deep vein thrombosis from the common femoral through the  popliteal veins.  - No evidence of superficial venous thrombosis.  - The common femoral vein is not competent.  - The great and small saphenous  veins are competent.     Left:  - No evidence of deep vein thrombosis from the common femoral through the  popliteal veins.  - No evidence of superficial venous thrombosis.  - The common femoral vein is not competent.  - The great and small saphenous veins are competent.     *See table(s) above for measurements and observations.  Echo 05/09/2021 (at PCP office) EF 67%, G1DD  Recent Labs: 03/03/2021: Hemoglobin 13.8; Platelets 209   Recent Lipid Panel    Component Value Date/Time   TRIG 117 10/13/2019 2355    Physical Exam:   VS:  BP 115/74   Pulse 89   Ht 5\' 9"  (1.753 m)   Wt 235 lb 3.2 oz (106.7 kg)   SpO2 98%   BMI  34.73 kg/m    Wt Readings from Last 3 Encounters:  08/05/21 235 lb 3.2 oz (106.7 kg)  05/14/21 232 lb (105.2 kg)  05/06/21 232 lb 1.3 oz (105.3 kg)    General: Well nourished, well developed, in no acute distress Head: Atraumatic, normal size  Eyes: PEERLA, EOMI  Neck: Supple, no JVD Endocrine: No thryomegaly Cardiac: Normal S1, S2; RRR; no murmurs, rubs, or gallops Lungs: Clear to auscultation bilaterally, no wheezing, rhonchi or rales  Abd: Soft, nontender, no hepatomegaly  Ext: Trace edema Musculoskeletal: No deformities, BUE and BLE strength normal and equal Skin: Warm and dry, no rashes   Neuro: Alert and oriented to person, place, time, and situation, CNII-XII grossly intact, no focal deficits  Psych: Normal mood and affect   ASSESSMENT:   Chad Reynolds is a 60 y.o. male who presents for the following: 1. Coronary artery disease involving native coronary artery of native heart without angina pectoris   2. Agatston coronary artery calcium score less than 100   3. Mixed hyperlipidemia   4. Edema of lower extremity   5. Obesity (BMI 30-39.9)     PLAN:   1. Coronary artery disease involving native coronary artery of native heart without angina pectoris 2. Agatston coronary artery calcium score less than 100 3. Mixed hyperlipidemia -calcium  score 80 (70th percentile).  Doing well on aspirin and statin.  No symptoms of angina.  We discussed he needs to exercise more.  Weight loss is recommended.  BP is well controlled.  Most recent LDL cholesterol 58.  Overall he is at goal regarding blood pressure as well as cholesterol.  I will make no changes to medications.  He will see [place] yearly.  4. Edema of lower extremity 5. Obesity (BMI 30-39.9) -Trace edema of the lower extremities.  Echocardiogram performed at PCP office was normal.  No evidence of valvular heart disease.  Cardiovascular examination is normal.  Recent vascular ultrasounds with common femoral venous reflux.  Suspect this is etiology.  I recommended exercise as well as leg elevation.  He also takes torsemide.  He understands being on torsemide and chlorthalidone could cause low potassium.  He will monitor this.  Disposition: Return in about 1 year (around 08/05/2022).  Medication Adjustments/Labs and Tests Ordered: Current medicines are reviewed at length with the patient today.  Concerns regarding medicines are outlined above.  Orders Placed This Encounter  Procedures   EKG 12-Lead   No orders of the defined types were placed in this encounter.   Patient Instructions  Medication Instructions:  No Changes In Medications at this time.  *If you need a refill on your cardiac medications before your next appointment, please call your pharmacy*  Follow-Up: At [facility], you and your health needs are our priority.  As part of our continuing mission to provide you with exceptional heart care, we have created designated Provider Care Teams.  These Care Teams include your primary Cardiologist (physician) and Advanced Practice Providers (APPs -  Physician Assistants and Nurse Practitioners) who all work together to provide you with the care you need, when you need it.  Your next appointment:   1 year(s)  The format for your next appointment:   In Person  Provider:    You may see Dr. Audie Box or one of the following Advanced Practice Providers on your designated Care Team:   Almyra Deforest, PA-C Fabian Sharp, [place] or  Roby Lofts, PA-C   Time Spent with Patient: I have spent a  total of 35 minutes with patient reviewing hospital notes, telemetry, EKGs, labs and examining the patient as well as establishing an assessment and plan that was discussed with the patient.  > 50% of time was spent in direct patient care.  Signed, Addison Naegeli. Audie Box, MD, [place]  [address], [place], [place] [address] [phone]  08/05/2021 9:13 AM

## 2021-08-05 ENCOUNTER — Ambulatory Visit (INDEPENDENT_AMBULATORY_CARE_PROVIDER_SITE_OTHER): Payer: 59 | Admitting: Cardiovascular Disease

## 2021-08-05 ENCOUNTER — Other Ambulatory Visit: Payer: Self-pay

## 2021-08-05 ENCOUNTER — Encounter: Payer: Self-pay | Admitting: Cardiovascular Disease

## 2021-08-05 VITALS — BP 115/74 | HR 89 | Ht 69.0 in | Wt 235.2 lb

## 2021-08-05 DIAGNOSIS — R600 Localized edema: Secondary | ICD-10-CM | POA: Diagnosis not present

## 2021-08-05 DIAGNOSIS — E782 Mixed hyperlipidemia: Secondary | ICD-10-CM | POA: Diagnosis not present

## 2021-08-05 DIAGNOSIS — R931 Abnormal findings on diagnostic imaging of heart and coronary circulation: Secondary | ICD-10-CM

## 2021-08-05 DIAGNOSIS — I2510 Atherosclerotic heart disease of native coronary artery without angina pectoris: Secondary | ICD-10-CM | POA: Diagnosis not present

## 2021-08-05 DIAGNOSIS — E669 Obesity, unspecified: Secondary | ICD-10-CM

## 2021-08-05 NOTE — Patient Instructions (Signed)
Medication Instructions:  No Changes In Medications at this time.  *If you need a refill on your cardiac medications before your next appointment, please call your pharmacy*  Follow-Up: At [facility], you and your health needs are our priority.  As part of our continuing mission to provide you with exceptional heart care, we have created designated Provider Care Teams.  These Care Teams include your primary Cardiologist (physician) and Advanced Practice Providers (APPs -  Physician Assistants and Nurse Practitioners) who all work together to provide you with the care you need, when you need it.  Your next appointment:   1 year(s)  The format for your next appointment:   In Person  Provider:   You may see Dr. Audie Box or one of the following Advanced Practice Providers on your designated Care Team:   Almyra Deforest, PA-C Fabian Sharp, PA-C or  Roby Lofts, [place]

## 2021-08-09 ENCOUNTER — Encounter (HOSPITAL_BASED_OUTPATIENT_CLINIC_OR_DEPARTMENT_OTHER): Payer: Self-pay

## 2021-08-09 ENCOUNTER — Emergency Department (HOSPITAL_BASED_OUTPATIENT_CLINIC_OR_DEPARTMENT_OTHER)
Admission: EM | Admit: 2021-08-09 | Discharge: 2021-08-09 | Disposition: A | Discharge status: Home / Self Care | Payer: 59 | Attending: Emergency Medicine | Admitting: Emergency Medicine

## 2021-08-09 ENCOUNTER — Other Ambulatory Visit: Payer: Self-pay

## 2021-08-09 DIAGNOSIS — Z79899 Other long term (current) drug therapy: Secondary | ICD-10-CM | POA: Diagnosis not present

## 2021-08-09 DIAGNOSIS — S80811A Abrasion, right lower leg, initial encounter: Secondary | ICD-10-CM | POA: Diagnosis not present

## 2021-08-09 DIAGNOSIS — L03115 Cellulitis of right lower limb: Secondary | ICD-10-CM

## 2021-08-09 DIAGNOSIS — I10 Essential (primary) hypertension: Secondary | ICD-10-CM | POA: Diagnosis not present

## 2021-08-09 DIAGNOSIS — Z7982 Long term (current) use of aspirin: Secondary | ICD-10-CM | POA: Insufficient documentation

## 2021-08-09 DIAGNOSIS — Z23 Encounter for immunization: Secondary | ICD-10-CM | POA: Insufficient documentation

## 2021-08-09 DIAGNOSIS — X58XXXA Exposure to other specified factors, initial encounter: Secondary | ICD-10-CM | POA: Insufficient documentation

## 2021-08-09 DIAGNOSIS — Z8616 Personal history of COVID-19: Secondary | ICD-10-CM | POA: Diagnosis not present

## 2021-08-09 DIAGNOSIS — S8991XA Unspecified injury of right lower leg, initial encounter: Secondary | ICD-10-CM | POA: Diagnosis present

## 2021-08-09 MED ORDER — CEPHALEXIN 500 MG PO CAPS: 500.0000 mg | ORAL_CAPSULE | Freq: Four times a day (QID) | ORAL | 0 refills | Status: DC

## 2021-08-09 MED ORDER — TETANUS-DIPHTH-ACELL PERTUSSIS 5-2.5-18.5 LF-MCG/0.5 IM SUSY
PREFILLED_SYRINGE | INTRAMUSCULAR | Status: AC
  Filled 2021-08-09: qty 0.5

## 2021-08-09 MED ORDER — TETANUS-DIPHTH-ACELL PERTUSSIS 5-2.5-18.5 LF-MCG/0.5 IM SUSY
0.5000 mL | PREFILLED_SYRINGE | Freq: Once | INTRAMUSCULAR | Status: AC
  Administered 2021-08-09: 0.5 mL via INTRAMUSCULAR

## 2021-08-09 NOTE — ED Provider Notes (Addendum)
[facility] EMERGENCY DEPT Provider Note   CSN: [id number] Arrival date & time: 08/09/21  0813     History Chief Complaint  Patient presents with   Leg Swelling    Chad Reynolds is a 60 y.o. male.  HPI  60 year old male presenting to the emergency department with right lower extremity redness, swelling, pain.  He sustained a small abrasion to his lower leg.  He states his tetanus is not up-to-date.  He initially developed redness around the abrasion which is now spread throughout his right lower extremity.  He states that it is similar to prior episode of cellulitis.  He denies any history of MRSA infection.  He endorses dull pain, nonradiating, onset over the past few days gradually.  No history of blood clots.  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   Dry skin 03/07/2021   Dyspnea on exertion 03/07/2021   Eczema 03/07/2021   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   Edema of lower extremity 03/07/2021   Encounter for therapeutic drug level monitoring 03/07/2021   Essential hypertension    Essential tremor 02/12/2018   EtOH dependence ([place]) 11/04/2018   Generalized anxiety disorder 03/07/2021   History of adenomatous polyp of colon 03/07/2021   History of infectious disease 03/07/2021   History of syncope 03/07/2021   Hypercalcemia 05/06/2021   Hypertension    Hypotension 10/14/2019   Lymphedema 05/06/2021    Nocturia 03/07/2021   Orthostatic hypotension 10/14/2019   Other specified abnormal findings of blood chemistry 05/06/2021   Prediabetes 03/07/2021   Pure hypercholesterolemia    Recurrent depression ([place]) 03/07/2021   Tinea pedis 03/07/2021    Patient Active Problem List   Diagnosis Date Noted   Anxiety 05/06/2021   Coronary atherosclerosis due to calcified coronary lesion 05/06/2021   Hypertension 05/06/2021   Hypercalcemia 05/06/2021   Lymphedema 05/06/2021   Other specified abnormal findings of blood chemistry 05/06/2021   Mixed dyslipidemia 05/06/2021   Obesity (BMI 30.0-34.9) 05/06/2021   Renal insufficiency 05/06/2021   Abnormal involuntary movement 03/07/2021   Anemia 03/07/2021   Androgen deficiency 03/07/2021   Atherosclerosis of coronary artery 03/07/2021   Atherosclerotic heart disease of native coronary artery without angina pectoris 03/07/2021   Benign prostatic hyperplasia with lower urinary tract symptoms 03/07/2021   Body mass index (BMI) 33.0-33.9, adult 03/07/2021   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   Dry skin 03/07/2021   Dyspnea on exertion 03/07/2021   Eczema 03/07/2021   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   Edema of lower extremity 03/07/2021   Encounter for therapeutic drug level monitoring 03/07/2021   Essential hypertension 03/07/2021   Generalized anxiety disorder 03/07/2021   History of adenomatous polyp of colon 03/07/2021   History of infectious disease 03/07/2021   History of syncope 03/07/2021   Nocturia 03/07/2021   Prediabetes 03/07/2021   Pure hypercholesterolemia 03/07/2021   Recurrent depression ([place]) 03/07/2021   Tinea pedis 03/07/2021   COVID-19 virus  infection 10/14/2019   Hypotension 10/14/2019   Orthostatic hypotension 10/14/2019   Bipolar 1 disorder, depressed ([place]) 10/13/2019   EtOH dependence ([place]) 11/04/2018   Essential tremor 02/12/2018    Past Surgical History:  Procedure Laterality Date    CATARACT EXTRACTION Bilateral        Family History  Problem Relation Age of Onset   Tremor Mother    Diabetes Mother    Diabetes Father    Atrial fibrillation Father    Heart disease Father    Lymphoma Father    Tremor Maternal Grandmother    Heart disease Paternal Grandfather     Social History   Tobacco Use   Smoking status: Never   Smokeless tobacco: Never  Vaping Use   Vaping Use: Never used  Substance Use Topics   Alcohol use: Not Currently   Drug use: No    Home Medications Prior to Admission medications   Medication Sig Start Date End Date Taking? Authorizing Provider  cephALEXin (KEFLEX) 500 MG capsule Take 1 capsule (500 mg total) by mouth 4 (four) times daily. 08/09/21  Yes Regan Lemming, MD  ARIPiprazole (ABILIFY) 5 MG tablet Take 1 tablet (5 mg total) by mouth daily. 04/11/21   Cottle, Billey Co., MD  aspirin 81 MG chewable tablet Chew 81 mg by mouth daily. 01/08/21   [provider]  Azilsartan-Chlorthalidone (EDARBYCLOR) 40-25 MG TABS Take by mouth daily.    [provider]  divalproex (DEPAKOTE ER) 500 MG 24 hr tablet Take 3 tablets (1,500 mg total) by mouth daily. 04/11/21   Cottle, Billey Co., MD  DULoxetine (CYMBALTA) 60 MG capsule Take 2 capsules (120 mg total) by mouth daily. 04/11/21   Cottle, Billey Co., MD  naltrexone (DEPADE) 50 MG tablet Take 1 tablet (50 mg total) by mouth at bedtime. 04/11/21   Cottle, Billey Co., MD  rosuvastatin (CRESTOR) 20 MG tablet Take 20 mg by mouth daily. 07/09/21   [provider]  torsemide (DEMADEX) 20 MG tablet Take 40 mg by mouth daily. 05/06/21   [provider]    Allergies    Patient has no known allergies.  Review of Systems   Review of Systems  Constitutional:  Negative for chills and fever.  HENT:  Negative for ear pain and sore throat.   Eyes:  Negative for pain and visual disturbance.  Respiratory:  Negative for cough and shortness of breath.   Cardiovascular:  Positive  for leg swelling. Negative for chest pain and palpitations.  Gastrointestinal:  Negative for abdominal pain and vomiting.  Genitourinary:  Negative for dysuria and hematuria.  Musculoskeletal:  Negative for arthralgias and back pain.  Skin:  Positive for color change and wound. Negative for rash.  Neurological:  Negative for seizures and syncope.  All other systems reviewed and are negative.  Physical Exam Updated Vital Signs BP 113/77 (BP Location: Right Arm)   Pulse 91   Temp 99.4 F (37.4 C)   Resp 16   Ht 5\' 10"  (1.778 m)   Wt 102.1 kg   SpO2 94%   BMI 32.28 kg/m   Physical Exam Vitals and nursing note reviewed.  Constitutional:      General: He is not in acute distress. HENT:     Head: Normocephalic and atraumatic.  Eyes:     Conjunctiva/sclera: Conjunctivae normal.     Pupils: Pupils are equal, round, and reactive to light.  Cardiovascular:     Rate and Rhythm: Normal rate  and regular rhythm.  Pulmonary:     Effort: Pulmonary effort is normal. No respiratory distress.  Abdominal:     General: There is no distension.     Tenderness: There is no guarding.  Musculoskeletal:        General: No deformity or signs of injury.     Cervical back: Normal range of motion and neck supple.     Right lower leg: Edema present.     Comments: Right lower extremity compartments are soft.  No pain with passive extension.  2+ DP pulses. No crepitus  Skin:    Findings: Erythema present. No lesion or rash.     Comments: Right lower extremity erythema, warmth, swelling.  Small abrasion to the anterior shin, hemostatic and appears to be healing by secondary intention.  Neurological:     General: No focal deficit present.     Mental Status: He is alert. Mental status is at baseline.    ED Results / Procedures / Treatments   Labs (all labs ordered are listed, but only abnormal results are displayed) Labs Reviewed - No data to display  EKG None  Radiology No results  found.  Procedures Procedures   Medications Ordered in ED Medications  Tdap (BOOSTRIX) injection 0.5 mL (has no administration in time range)    ED Course  I have reviewed the triage vital signs and the nursing notes.  Pertinent labs & imaging results that were available during my care of the patient were reviewed by me and considered in my medical decision making (see chart for details).    MDM Rules/Calculators/A&P                           60 year old male presenting to the emergency department with concern for right lower extremity cellulitis after sustaining an abrasion to his right anterior leg.  Compartments are soft.  No crepitus on exam.  Vital stable with no systemic signs of infection.  Concern for simple cellulitis with low suspicion for developing abscess.  Low concern for DVT given the history of present illness and physical exam.  Patient's tetanus is not up-to-date and was updated in the emergency department.  We will treat outpatient with Keflex.  Return precautions provided.  Final Clinical Impression(s) / ED Diagnoses Final diagnoses:  Cellulitis of right lower extremity    Rx / DC Orders ED Discharge Orders          Ordered    cephALEXin (KEFLEX) 500 MG capsule  4 times daily        08/09/21 0915             Regan Lemming, MD 08/09/21 [id number]    Regan Lemming, MD 08/09/21 [phone]

## 2021-08-09 NOTE — ED Triage Notes (Signed)
Complains of swelling and redness to right leg.  Small abrasion to lower leg but knot he is complaining of below the knee medially.

## 2021-08-14 ENCOUNTER — Ambulatory Visit (INDEPENDENT_AMBULATORY_CARE_PROVIDER_SITE_OTHER): Payer: 59 | Admitting: Psychiatry

## 2021-08-14 ENCOUNTER — Other Ambulatory Visit: Payer: Self-pay

## 2021-08-14 ENCOUNTER — Encounter: Payer: Self-pay | Admitting: Psychiatry

## 2021-08-14 DIAGNOSIS — F411 Generalized anxiety disorder: Secondary | ICD-10-CM | POA: Diagnosis not present

## 2021-08-14 DIAGNOSIS — F3341 Major depressive disorder, recurrent, in partial remission: Secondary | ICD-10-CM | POA: Diagnosis not present

## 2021-08-14 DIAGNOSIS — F1021 Alcohol dependence, in remission: Secondary | ICD-10-CM

## 2021-08-14 MED ORDER — ARIPIPRAZOLE 15 MG PO TABS: 7.5000 mg | ORAL_TABLET | Freq: Every day | ORAL | 0 refills | Status: DC

## 2021-08-14 NOTE — Progress Notes (Signed)
NICHOLS CORTER [id number] 04-24-1961 60 y.o.  Subjective:   Patient ID:  Chad Reynolds is a 60 y.o. (DOB [birth year]) male.  Chief Complaint:  Chief Complaint  Patient presents with   Follow-up   Depression    Depression        Associated symptoms include fatigue. 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  Last visit was December 2020.  he was doing well.  No meds were changed.  He was taking duloxetine 90 mg, Depakote ER 1500 mg, naltrexone, and stopped Campral.  04/26/20 appt with following noted:  Seen with wife. Both hospitalized with Covid in December.   Fully recovered.  Vaccinated. Work up for fatigue testosterone started.  Not in normal range yet. Some ups and downs lately good and bad without reason. Wife noticing more depression with negative thoughts and dwelling on death.  But mood swings are under control.  No SE he notices.  Wife sees more scratching of himself but he says it's a chronic problem. Really busy working 2 jobs.    Patient reports stable mood and denies irritable moods. He agrees he's more negative and down.   Patient denies any recent difficulty with anxiety.  Patient has occ difficulty with sleep initiation or maintenance. Denies appetite disturbance.  Patient reports that energy and motivation have been good.  Patient denies any difficulty with concentration.  Patient denies any suicidal ideation.  Sometimes crazy dreams. Plan: For depression increase duloxetine to 120 mg daily.  06/28/20 appt with the following noted: Not taking naltrexone he thinks. Is taking duloxetine 120 mg daily and Depakote 1500 mg daily. Working 2 jobs. Helped with increase Duloxetine.  Less tense, more level.  Less down and less negative thinking as often.  Can find some things he enjoys.  Walks with Chad Reynolds.  Working on losing weight.  Today's F's 59 yo today. Some sleep issues DT  nocturia.  02/07/21 appt noted: Jan March higher stress with business and personal.  Wife's family member died suicide.  Another family member died cancer.  F health issues and hosp lately in rehab.  M bad tremors.   Affected him with more down and anxiety, esp more down.  Wonders about increasing Cymbalta.  Handles stress better than the past and not abusing alcohol like in the past.  On duloxetine 120 and Depakote 1500.   Plan: For depression increase duloxetine to 120 mg daily was helpful at the time Continue Depakote 1500 mg daily for irritability Abilify potentiation 2.5 mg AM for 1 week and if no better then 5 mg AM   04-19-2021 appt noted: F died 3 weeks ago. Abilify helped depression and wife noticed.  More mellow and less stressed. Increased to 5 mg without SE. Grief.  Anxiety now more than depression but manageable.  Not overly anxious.  Depakote and Abilify seemed to help the most. Doing well with alcohol. Occ beer without excess now.  Minimal alcohol.  No longer hangs out with friends who drink to excess. Plan: For depression duloxetine to 120 mg daily was helpful at the time Continue Depakote 1500 mg daily for irritability Benefit so continue Abilify potentiation  5 mg AM   08/14/21 appt noted: Doing OK.  Hectic and busier at work. Mood is OK.  A lot more leveled out with less highs and lows than before. Disc Abilify and switched to night bc thought he was a little tired  from it.  Past Psychiatric Medication Trials: Duloxetine 120, Lexapro, Viibryd,  Depakote 1500  Review of Systems:  Review of Systems  Constitutional:  Positive for fatigue and unexpected weight change.  Skin:  Positive for rash.  Neurological:  Negative for tremors and weakness.  Psychiatric/Behavioral:  Positive for depression.    Medications: I have reviewed the patient's current medications.  Current Outpatient Medications  Medication Sig Dispense Refill   ARIPiprazole (ABILIFY) 15 MG tablet Take 0.5  tablets (7.5 mg total) by mouth daily. 45 tablet 0   aspirin 81 MG chewable tablet Chew 81 mg by mouth daily.     Azilsartan-Chlorthalidone (EDARBYCLOR) 40-25 MG TABS Take by mouth daily.     cephALEXin (KEFLEX) 500 MG capsule Take 1 capsule (500 mg total) by mouth 4 (four) times daily. 20 capsule 0   divalproex (DEPAKOTE ER) 500 MG 24 hr tablet Take 3 tablets (1,500 mg total) by mouth daily. 270 tablet 1   DULoxetine (CYMBALTA) 60 MG capsule Take 2 capsules (120 mg total) by mouth daily. 180 capsule 1   naltrexone (DEPADE) 50 MG tablet Take 1 tablet (50 mg total) by mouth at bedtime. 90 tablet 1   rosuvastatin (CRESTOR) 20 MG tablet Take 20 mg by mouth daily.     torsemide (DEMADEX) 20 MG tablet Take 40 mg by mouth daily.     No current facility-administered medications for this visit.    Medication Side Effects: None  Allergies: No Known Allergies  Past Medical History:  Diagnosis Date   Abnormal involuntary movement 03/07/2021   Androgen deficiency 03/07/2021   Anemia 03/07/2021   Anxiety    Atherosclerosis of coronary artery 03/07/2021   Atherosclerotic heart disease of native coronary artery without angina pectoris    Benign prostatic hyperplasia with lower urinary tract symptoms 03/07/2021   Bipolar 1 disorder, depressed ([place]) 10/13/2019   Body mass index (BMI) 33.0-33.9, adult 03/07/2021   Coronary atherosclerosis due to calcified coronary lesion    COVID-19 virus infection 10/14/2019   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   Dry skin 03/07/2021   Dyspnea on exertion 03/07/2021   Eczema 03/07/2021   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   Edema of lower extremity 03/07/2021   Encounter for therapeutic drug level monitoring 03/07/2021   Essential hypertension    Essential tremor 02/12/2018   EtOH dependence ([place]) 11/04/2018   Generalized anxiety disorder 03/07/2021   History of adenomatous polyp of colon 03/07/2021   History of infectious disease 03/07/2021    History of syncope 03/07/2021   Hypercalcemia 05/06/2021   Hypertension    Hypotension 10/14/2019   Lymphedema 05/06/2021   Nocturia 03/07/2021   Orthostatic hypotension 10/14/2019   Other specified abnormal findings of blood chemistry 05/06/2021   Prediabetes 03/07/2021   Pure hypercholesterolemia    Recurrent depression ([place]) 03/07/2021   Tinea pedis 03/07/2021    Family History  Problem Relation Age of Onset   Tremor Mother    Diabetes Mother    Diabetes Father    Atrial fibrillation Father    Heart disease Father    Lymphoma Father    Tremor Maternal Grandmother    Heart disease Paternal Grandfather     Social History   Socioeconomic History   Marital status: Married    Spouse name: Not on file   Number of children: 2   Years of education: BA   Highest education level: Not on file  Occupational History   Occupation: [employer]  Tobacco  Use   Smoking status: Never   Smokeless tobacco: Never  Vaping Use   Vaping Use: Never used  Substance and Sexual Activity   Alcohol use: Not Currently   Drug use: No   Sexual activity: Not Currently  Other Topics Concern   Not on file  Social History Narrative   Lives with wife   Caffeine use: daily   Right handed       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   Please see review of systems for further details on the patient's review from today.   Objective:   Physical Exam:  There were no vitals taken for this visit.  Physical Exam Constitutional:      General: He is not in acute distress.    Appearance: He is well-developed.  Musculoskeletal:        General:  No deformity.  Neurological:     Mental Status: He is alert and oriented to person, place, and time.     Coordination: Coordination normal.  Psychiatric:        Attention and Perception: He is attentive. He does not perceive auditory hallucinations.        Mood and Affect: Mood is anxious. Mood is not depressed. Affect is not labile, blunt, angry or inappropriate.        Speech: Speech normal. Speech is not rapid and pressured.        Behavior: Behavior normal.        Thought Content: Thought content normal. Thought content does not include homicidal or suicidal ideation. Thought content does not include homicidal or suicidal plan.        Cognition and Memory: Cognition normal.        Judgment: Judgment normal.     Comments: Insight intact. No auditory or visual hallucinations. No delusions.  Depression managed.    Lab Review:     Component Value Date/Time   NA 140 12/14/2019 0815   K 3.7 12/14/2019 0815   CL 100 12/14/2019 0815   CO2 30 12/14/2019 0815   GLUCOSE 99 12/14/2019 0815   BUN 18 12/14/2019 0815   CREATININE 1.00 12/14/2019 0815   CALCIUM 9.2 12/14/2019 0815   PROT 7.0 12/14/2019 0815   ALBUMIN 4.1 12/14/2019 0815   AST 16 12/14/2019 0815   ALT 20 12/14/2019 0815   ALKPHOS 45 12/14/2019 0815   BILITOT 0.4 12/14/2019 0815   GFRNONAA >60 12/14/2019 0815   GFRAA >60 12/14/2019 0815       Component Value Date/Time   WBC 8.3 03/03/2021 1925   RBC 4.45 03/03/2021 1925   HGB 13.8 03/03/2021 1925   HGB 14.0 12/14/2019 0815   HCT 39.4 03/03/2021 1925   PLT 209 03/03/2021 1925   PLT 242 12/14/2019 0815   MCV 88.5 03/03/2021 1925   MCH 31.0 03/03/2021 1925   MCHC 35.0 03/03/2021 1925   RDW 11.8 03/03/2021 1925   LYMPHSABS 3.2 03/03/2021 1925   MONOABS 0.7 03/03/2021 1925   EOSABS 0.2 03/03/2021 1925   BASOSABS 0.1 03/03/2021 1925    No results found for: POCLITH, LITHIUM  No results found for: PHENYTOIN, PHENOBARB, VALPROATE, CBMZ   .res Assessment:  Plan:    Larin was seen today for follow-up and depression.  Diagnoses and all orders for this visit:  Depression, major, recurrent, in partial remission ([place]) -     ARIPiprazole (ABILIFY) 15 MG tablet; Take 0.5 tablets (7.5 mg total) by mouth daily.  Generalized anxiety disorder  Alcohol dependence in remission [facility])  Chad Reynolds has had a good response to a combination of duloxetine plus Depakote.  Depakote was added for irritability which was not adequately addressed by the duloxetine.  His depression is again under control.  His irritability is under control.  His wife has had a lot of input and she agrees with a good response.  For depression duloxetine to 120 mg daily was helpful at the time Start replacing VPA with Abilify to reduce number of meds, polypharmachy. Increase Abilify to 7.5 and reduce Depakote to 1000 mg  Next visit continue transituion if successful  Chad Reynolds is also been able to greatly reduce his alcohol intake and appears to no longer be abusing alcohol.  His wife would like him to completely stop which she has not done but he is able to go for days sometimes weeks at a time with no alcohol.  Discussed potential metabolic side effects associated with atypical antipsychotics, as well as potential risk for movement side effects. Advised pt to contact office if movement side effects occur.  F just died.  Now  Not time for med changes   Follow-up 2 mos  Lynder Parents MD, DFAPA  Please see After Visit Summary for patient specific instructions.  No future appointments.  No orders of the defined types were placed in this encounter.   -------------------------------

## 2021-10-23 ENCOUNTER — Other Ambulatory Visit: Payer: Self-pay | Admitting: Nephrology

## 2021-10-23 DIAGNOSIS — N183 Chronic kidney disease, stage 3 unspecified: Secondary | ICD-10-CM

## 2021-11-08 ENCOUNTER — Other Ambulatory Visit: Payer: Self-pay | Admitting: Psychiatry

## 2021-11-08 ENCOUNTER — Ambulatory Visit
Admission: RE | Admit: 2021-11-08 | Discharge: 2021-11-08 | Disposition: A | Discharge status: Home / Self Care | Payer: 59 | Source: Ambulatory Visit | Attending: Nephrology | Admitting: Nephrology

## 2021-11-08 DIAGNOSIS — N183 Chronic kidney disease, stage 3 unspecified: Secondary | ICD-10-CM

## 2021-11-08 DIAGNOSIS — F3341 Major depressive disorder, recurrent, in partial remission: Secondary | ICD-10-CM

## 2021-11-08 NOTE — Telephone Encounter (Signed)
90 day ok?

## 2021-11-14 ENCOUNTER — Ambulatory Visit (INDEPENDENT_AMBULATORY_CARE_PROVIDER_SITE_OTHER): Payer: 59 | Admitting: Psychiatry

## 2021-11-14 ENCOUNTER — Encounter: Payer: Self-pay | Admitting: Psychiatry

## 2021-11-14 ENCOUNTER — Other Ambulatory Visit: Payer: Self-pay

## 2021-11-14 DIAGNOSIS — F3341 Major depressive disorder, recurrent, in partial remission: Secondary | ICD-10-CM

## 2021-11-14 DIAGNOSIS — F1021 Alcohol dependence, in remission: Secondary | ICD-10-CM | POA: Diagnosis not present

## 2021-11-14 DIAGNOSIS — F411 Generalized anxiety disorder: Secondary | ICD-10-CM | POA: Diagnosis not present

## 2021-11-14 MED ORDER — DULOXETINE HCL 60 MG PO CPEP: 120.0000 mg | ORAL_CAPSULE | Freq: Every day | ORAL | 0 refills | Status: DC

## 2021-11-14 MED ORDER — DIVALPROEX SODIUM ER 500 MG PO TB24: 1500.0000 mg | ORAL_TABLET | Freq: Every day | ORAL | 0 refills | Status: DC

## 2021-11-14 MED ORDER — NALTREXONE HCL 50 MG PO TABS: 50.0000 mg | ORAL_TABLET | Freq: Every day | ORAL | 1 refills | Status: DC

## 2021-11-14 NOTE — Progress Notes (Signed)
Chad Reynolds [id number] 1961-02-28 61 y.o.  Subjective:   Patient ID:  Chad Reynolds is a 61 y.o. (DOB 1961/01/28) male.  Chief Complaint:  Chief Complaint  Patient presents with   Follow-up   Depression    Depression        Associated symptoms include fatigue. 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  Last visit was December 2020.  he was doing well.  No meds were changed.  He was taking duloxetine 90 mg, Depakote ER 1500 mg, naltrexone, and stopped Campral.  04/26/20 appt with following noted:  Seen with wife. Both hospitalized with Covid in December.   Fully recovered.  Vaccinated. Work up for fatigue testosterone started.  Not in normal range yet. Some ups and downs lately good and bad without reason. Wife noticing more depression with negative thoughts and dwelling on death.  But mood swings are under control.  No SE he notices.  Wife sees more scratching of himself but he says it's a chronic problem. Really busy working 2 jobs.    Patient reports stable mood and denies irritable moods. He agrees he's more negative and down.   Patient denies any recent difficulty with anxiety.  Patient has occ difficulty with sleep initiation or maintenance. Denies appetite disturbance.  Patient reports that energy and motivation have been good.  Patient denies any difficulty with concentration.  Patient denies any suicidal ideation.  Sometimes crazy dreams. Plan: For depression increase duloxetine to 120 mg daily.  06/28/20 appt with the following noted: Not taking naltrexone he thinks. Is taking duloxetine 120 mg daily and Depakote 1500 mg daily. Working 2 jobs. Helped with increase Duloxetine.  Less tense, more level.  Less down and less negative thinking as often.  Can find some things he enjoys.  Walks with [name].  Working on losing weight.  Today's F's 60 yo today. Some sleep issues DT  nocturia.  02/07/21 appt noted: Jan March higher stress with business and personal.  Wife's family member died suicide.  Another family member died cancer.  F health issues and hosp lately in rehab.  M bad tremors.   Affected him with more down and anxiety, esp more down.  Wonders about increasing Cymbalta.  Handles stress better than the past and not abusing alcohol like in the past.  On duloxetine 120 and Depakote 1500.   Plan: For depression increase duloxetine to 120 mg daily was helpful at the time Continue Depakote 1500 mg daily for irritability Abilify potentiation 2.5 mg AM for 1 week and if no better then 5 mg AM   05/07/2021 appt noted: F died 3 weeks ago. Abilify helped depression and wife noticed.  More mellow and less stressed. Increased to 5 mg without SE. Grief.  Anxiety now more than depression but manageable.  Not overly anxious.  Depakote and Abilify seemed to help the most. Doing well with alcohol. Occ beer without excess now.  Minimal alcohol.  No longer hangs out with friends who drink to excess. Plan: For depression duloxetine to 120 mg daily was helpful at the time Continue Depakote 1500 mg daily for irritability Benefit so continue Abilify potentiation  5 mg AM   08/14/21 appt noted: Doing OK.  Hectic and busier at work. Mood is OK.  A lot more leveled out with less highs and lows than before. Disc Abilify and switched to night bc thought he was a little tired  from it. Plan: For depression duloxetine to 120 mg daily was helpful at the time Start replacing VPA with Abilify to reduce number of meds, polypharmachy. Increase Abilify to 7.5 and reduce Depakote to 1000 mg  Next visit continue transituion if successful  11/14/2021 appt noted: Getting botox for tremor, essential. Not a whole lot of difference.  Not taking Abilify for the last month or so.  Mood has been stable.   Past Psychiatric Medication Trials: Duloxetine 120, Lexapro, Viibryd,  Depakote 1500  Review  of Systems:  Review of Systems  Constitutional:  Positive for fatigue and unexpected weight change.  Skin:  Positive for rash.  Neurological:  Positive for tremors. Negative for weakness.   Medications: I have reviewed the patient's current medications.  Current Outpatient Medications  Medication Sig Dispense Refill   aspirin 81 MG chewable tablet Chew 81 mg by mouth daily.     Azilsartan-Chlorthalidone (EDARBYCLOR) 40-25 MG TABS Take by mouth daily.     torsemide (DEMADEX) 20 MG tablet Take 40 mg by mouth daily.     divalproex (DEPAKOTE ER) 500 MG 24 hr tablet Take 3 tablets (1,500 mg total) by mouth daily. 270 tablet 0   DULoxetine (CYMBALTA) 60 MG capsule Take 2 capsules (120 mg total) by mouth daily. 180 capsule 0   naltrexone (DEPADE) 50 MG tablet Take 1 tablet (50 mg total) by mouth at bedtime. 90 tablet 1   rosuvastatin (CRESTOR) 20 MG tablet Take 20 mg by mouth daily. (Patient not taking: Reported on 11/14/2021)     No current facility-administered medications for this visit.    Medication Side Effects: None  Allergies: No Known Allergies  Past Medical History:  Diagnosis Date   Abnormal involuntary movement 03/07/2021   Androgen deficiency 03/07/2021   Anemia 03/07/2021   Anxiety    Atherosclerosis of coronary artery 03/07/2021   Atherosclerotic heart disease of native coronary artery without angina pectoris    Benign prostatic hyperplasia with lower urinary tract symptoms 03/07/2021   Bipolar 1 disorder, depressed ([place]) 10/13/2019   Body mass index (BMI) 33.0-33.9, adult 03/07/2021   Coronary atherosclerosis due to calcified coronary lesion    COVID-19 virus infection 10/14/2019   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   Dry skin 03/07/2021   Dyspnea on exertion 03/07/2021   Eczema 03/07/2021   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   Edema of lower extremity 03/07/2021   Encounter for therapeutic drug level monitoring 03/07/2021   Essential hypertension     Essential tremor 02/12/2018   EtOH dependence ([place]) 11/04/2018   Generalized anxiety disorder 03/07/2021   History of adenomatous polyp of colon 03/07/2021   History of infectious disease 03/07/2021   History of syncope 03/07/2021   Hypercalcemia 05/06/2021   Hypertension    Hypotension 10/14/2019   Lymphedema 05/06/2021   Nocturia 03/07/2021   Orthostatic hypotension 10/14/2019   Other specified abnormal findings of blood chemistry 05/06/2021   Prediabetes 03/07/2021   Pure hypercholesterolemia    Recurrent depression ([place]) 03/07/2021   Tinea pedis 03/07/2021    Family History  Problem Relation Age of Onset   Tremor Mother    Diabetes Mother    Diabetes Father    Atrial fibrillation Father    Heart disease Father    Lymphoma Father    Tremor Maternal Grandmother    Heart disease Paternal Grandfather     Social History   Socioeconomic History   Marital status: Married    Spouse name: Not on  file   Number of children: 2   Years of education: BA   Highest education level: Not on file  Occupational History   Occupation: [employer]  Tobacco Use   Smoking status: Never   Smokeless tobacco: Never  Vaping Use   Vaping Use: Never used  Substance and Sexual Activity   Alcohol use: Not Currently   Drug use: No   Sexual activity: Not Currently  Other Topics Concern   Not on file  Social History Narrative   Lives with wife   Caffeine use: daily   Right handed       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   Please see review of systems for further details on the patient's review from today.   Objective:   Physical  Exam:  There were no vitals taken for this visit.  Physical Exam Constitutional:      General: He is not in acute distress.    Appearance: He is well-developed.  Musculoskeletal:        General: No deformity.  Neurological:     Mental Status: He is alert and oriented to person, place, and time.     Coordination: Coordination normal.  Psychiatric:        Attention and Perception: He is attentive. He does not perceive auditory hallucinations.        Mood and Affect: Mood is not anxious or depressed. Affect is not labile, blunt, angry or inappropriate.        Speech: Speech normal. Speech is not rapid and pressured.        Behavior: Behavior normal.        Thought Content: Thought content normal. Thought content is not delusional. Thought content does not include homicidal or suicidal ideation. Thought content does not include suicidal plan.        Cognition and Memory: Cognition normal.        Judgment: Judgment normal.     Comments: Insight intact. No auditory or visual hallucinations. No delusions.  Depression managed.    Lab Review:     Component Value Date/Time   NA 140 12/14/2019 0815   K 3.7 12/14/2019 0815   CL 100 12/14/2019 0815   CO2 30 12/14/2019 0815   GLUCOSE 99 12/14/2019 0815   BUN 18 12/14/2019 0815   CREATININE 1.00 12/14/2019 0815   CALCIUM 9.2 12/14/2019 0815   PROT 7.0 12/14/2019 0815   ALBUMIN 4.1 12/14/2019 0815   AST 16 12/14/2019 0815   ALT 20 12/14/2019 0815   ALKPHOS 45 12/14/2019 0815   BILITOT 0.4 12/14/2019 0815   GFRNONAA >60 12/14/2019 0815   GFRAA >60 12/14/2019 0815       Component Value Date/Time   WBC 8.3 03/03/2021 1925   RBC 4.45 03/03/2021 1925   HGB 13.8 03/03/2021 1925   HGB 14.0 12/14/2019 0815   HCT 39.4 03/03/2021 1925   PLT 209 03/03/2021 1925   PLT 242 12/14/2019 0815   MCV 88.5 03/03/2021 1925   MCH 31.0 03/03/2021 1925   MCHC 35.0 03/03/2021 1925   RDW 11.8 03/03/2021 1925  LYMPHSABS 3.2 03/03/2021 1925    MONOABS 0.7 03/03/2021 1925   EOSABS 0.2 03/03/2021 1925   BASOSABS 0.1 03/03/2021 1925    No results found for: POCLITH, LITHIUM   No results found for: PHENYTOIN, PHENOBARB, VALPROATE, CBMZ   .res Assessment: Plan:    Leaman was seen today for follow-up and depression.  Diagnoses and all orders for this visit:  Depression, major, recurrent, in partial remission (HCC) -     divalproex (DEPAKOTE ER) 500 MG 24 hr tablet; Take 3 tablets (1,500 mg total) by mouth daily. -     DULoxetine (CYMBALTA) 60 MG capsule; Take 2 capsules (120 mg total) by mouth daily.  Generalized anxiety disorder  Alcohol dependence in remission (HCC) -     naltrexone (DEPADE) 50 MG tablet; Take 1 tablet (50 mg total) by mouth at bedtime.   Richardson Landry has had a good response to a combination of duloxetine plus Depakote.  Depakote was added for irritability which was not adequately addressed by the duloxetine.  His depression is again under control.  His irritability is under control.  His wife has had a lot of input and she agrees with a good response.  Reviewed reasons he started and stopped Abilify and disc risk of relapse with depression.  For depression duloxetine to 120 mg daily was helpful at the time Continue Depakote to 1000 mg bc irritability is ok on this dose. Will hold Abilify for now since he's not depressed.   Richardson Landry is also been able to greatly reduce his alcohol intake and appears to no longer be abusing alcohol.  His wife would like him to completely stop which she has not done but he is able to go for days sometimes weeks at a time with no alcohol.  Discussed potential metabolic side effects associated with atypical antipsychotics, as well as potential risk for movement side effects. Advised pt to contact office if movement side effects occur.  Follow-up 3-4 mos  Lynder Parents MD, DFAPA  Please see After Visit Summary for patient specific instructions.  No future appointments.  No orders  of the defined types were placed in this encounter.    -------------------------------

## 2022-02-04 ENCOUNTER — Encounter: Payer: Self-pay | Admitting: *Deleted

## 2022-03-27 IMAGING — US US RENAL
1 series · 14 of 25 positions shown · non-contrast
Comparison: None.

CLINICAL DATA: Stage III chronic renal disease.

EXAM:
RENAL / URINARY TRACT ULTRASOUND COMPLETE

[Series 1: us renal · 0.26mm/px · 14 of 27 slices shown]
[im 1/27]
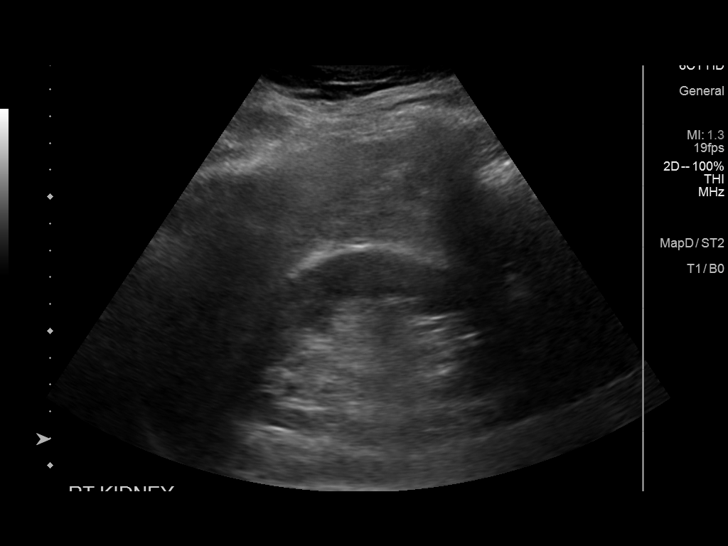
[im 3/27]
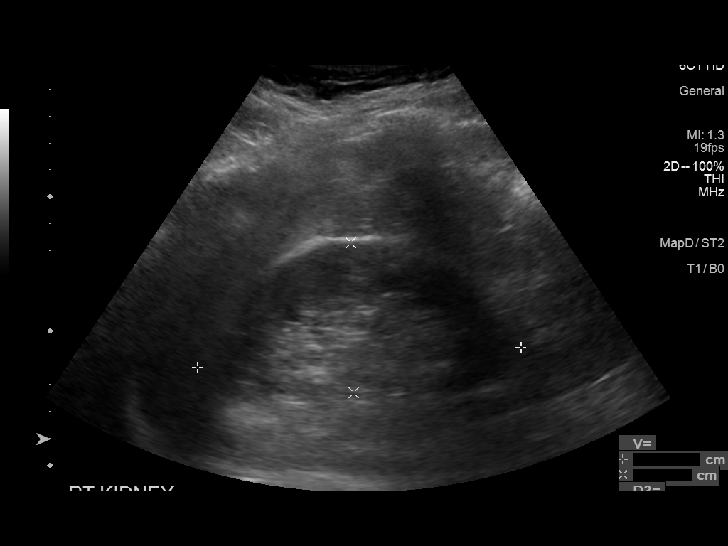
[im 5/27]
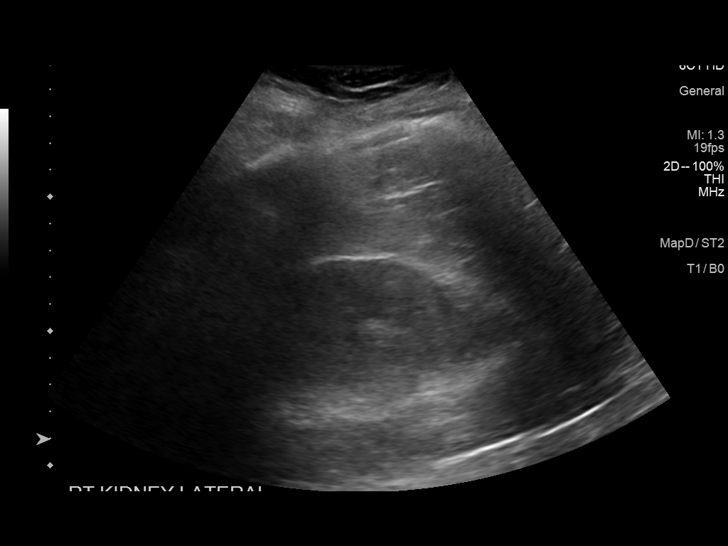
[im 7/27]
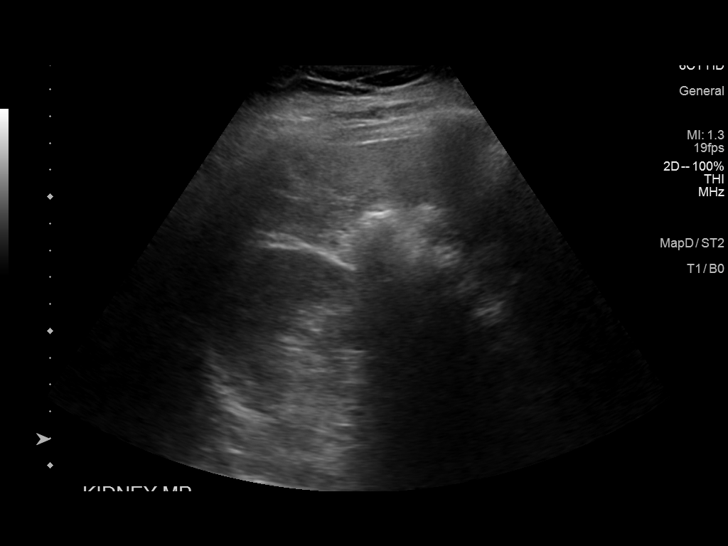
[im 9/27]
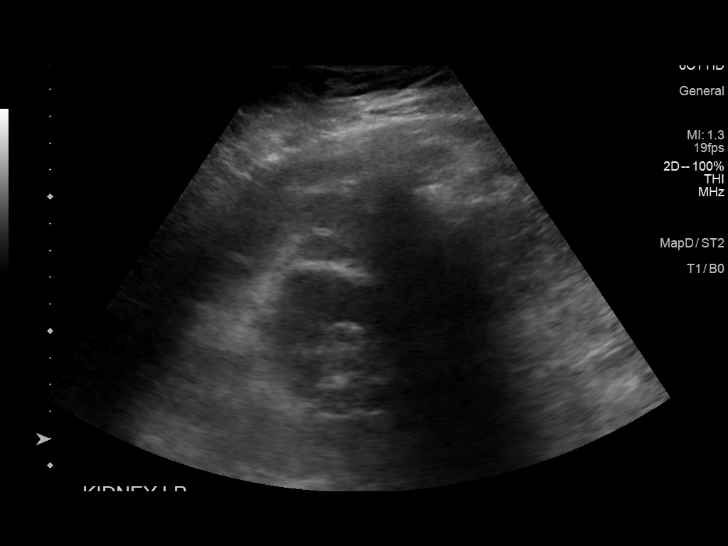
[im 10/27]
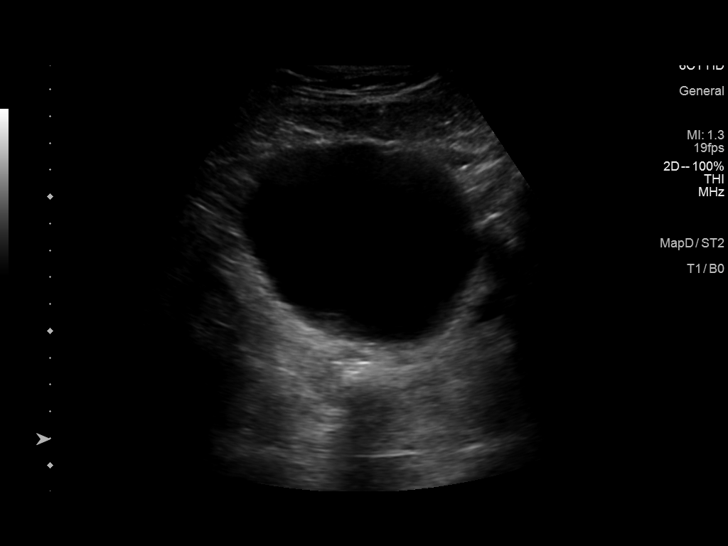
[im 12/27]
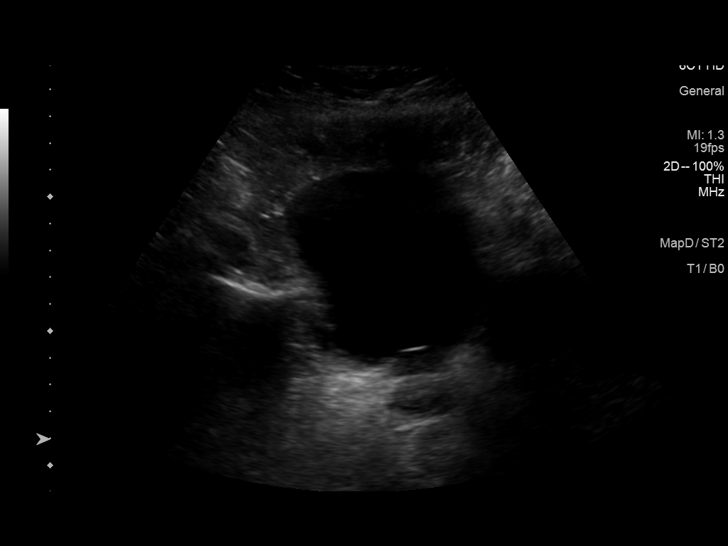
[im 15/27]
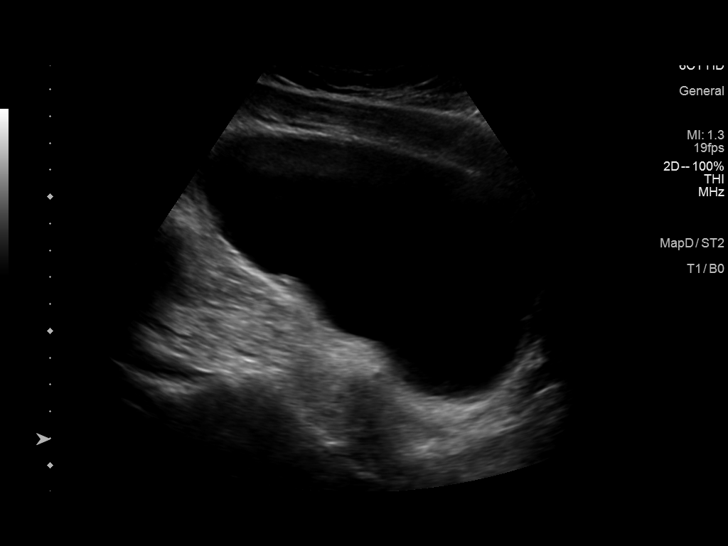
[im 17/27]
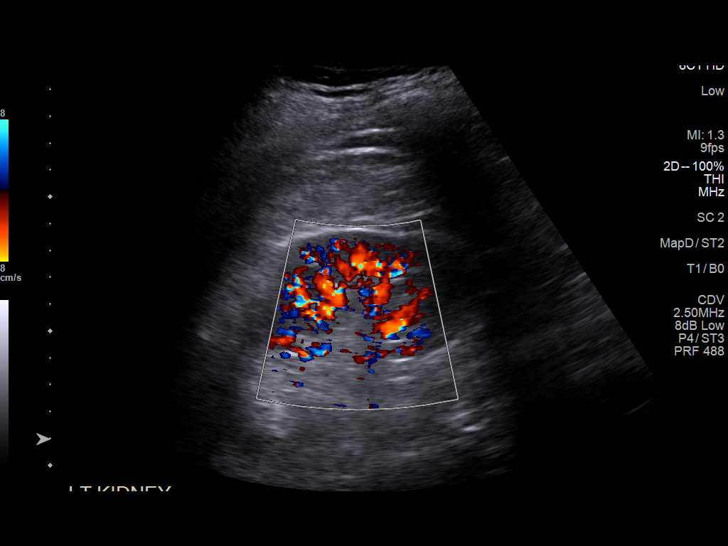
[im 18/27]
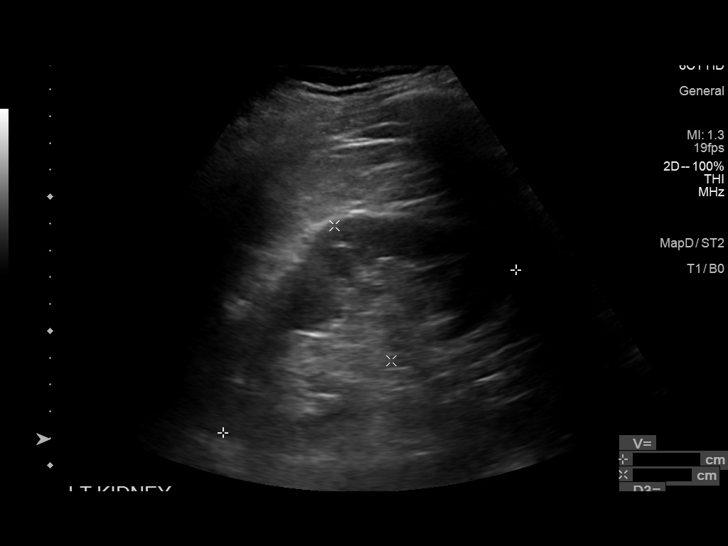
[im 20/27]
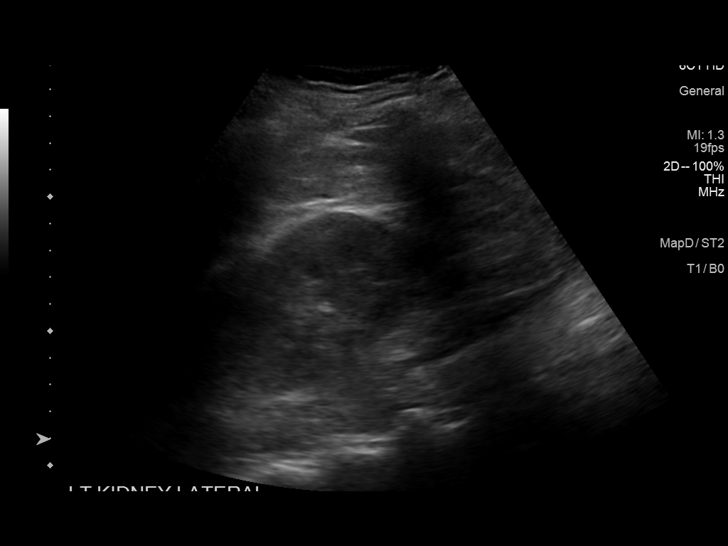
[im 22/27]
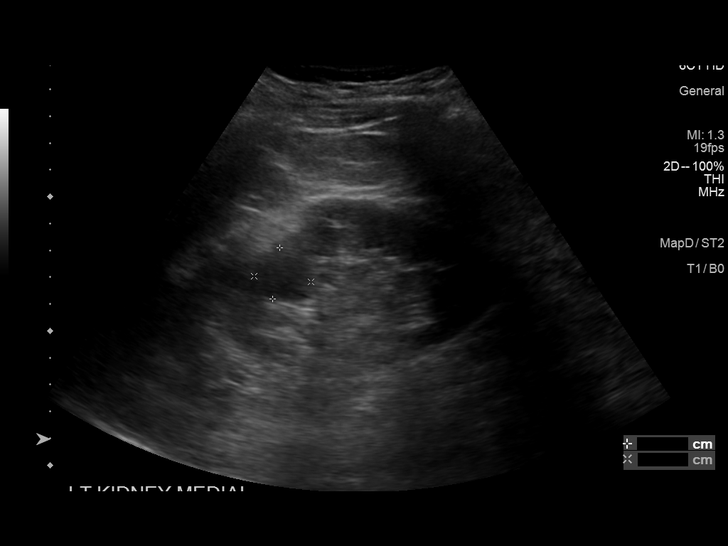
[im 24/27]
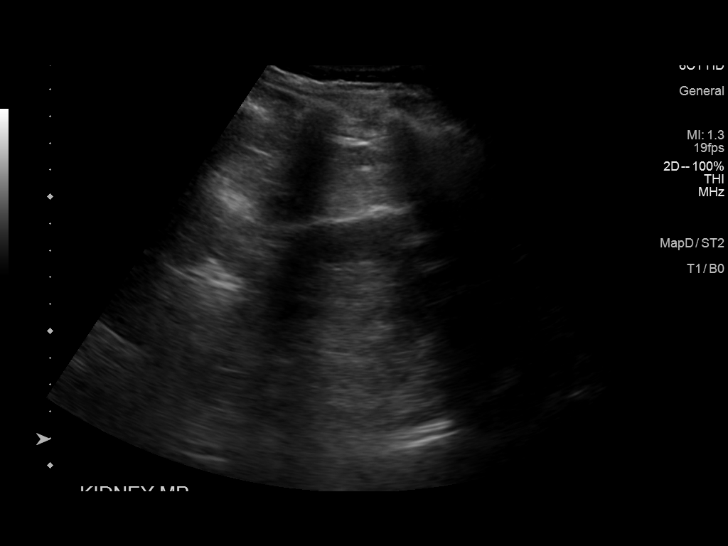
[im 27/27]
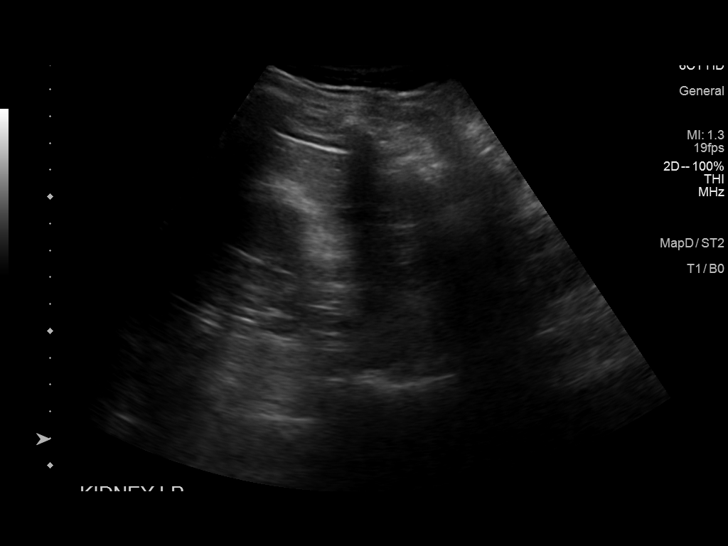

[14 of 25 positions shown; findings below may reference images not displayed]

FINDINGS: Right Kidney:

Renal measurements: 12.1 x 5.6 x 5.2 cm = volume: 183 mL.
Echogenicity within normal limits. No mass or hydronephrosis
visualized.

Left Kidney:

Renal measurements: 12.5 x 5.5 x 5.8 cm = volume: 209 mL. Contains a
2.1 cm cyst.

Bladder:

Appears normal for degree of bladder distention.

Other:

None.
IMPRESSION: 2.1 cm cyst in the left kidney.  No other abnormalities identified.

## 2022-05-07 ENCOUNTER — Other Ambulatory Visit: Payer: Self-pay | Admitting: Psychiatry

## 2022-05-07 DIAGNOSIS — F3341 Major depressive disorder, recurrent, in partial remission: Secondary | ICD-10-CM

## 2022-05-07 NOTE — Telephone Encounter (Signed)
LVM to verify Depakote dose. RF is for 3, per notes looks like he is on 2.

## 2022-05-15 ENCOUNTER — Encounter: Payer: Self-pay | Admitting: Psychiatry

## 2022-05-15 ENCOUNTER — Ambulatory Visit: Payer: 59 | Admitting: Psychiatry

## 2022-05-15 DIAGNOSIS — F3341 Major depressive disorder, recurrent, in partial remission: Secondary | ICD-10-CM

## 2022-05-15 DIAGNOSIS — F1021 Alcohol dependence, in remission: Secondary | ICD-10-CM

## 2022-05-15 DIAGNOSIS — F411 Generalized anxiety disorder: Secondary | ICD-10-CM | POA: Diagnosis not present

## 2022-05-15 MED ORDER — DIVALPROEX SODIUM ER 500 MG PO TB24: 1000.0000 mg | ORAL_TABLET | Freq: Every day | ORAL | 1 refills | Status: DC

## 2022-05-15 MED ORDER — DULOXETINE HCL 60 MG PO CPEP: 120.0000 mg | ORAL_CAPSULE | Freq: Every day | ORAL | 1 refills | Status: DC

## 2022-05-15 MED ORDER — NALTREXONE HCL 50 MG PO TABS: 50.0000 mg | ORAL_TABLET | Freq: Every day | ORAL | 1 refills | Status: DC

## 2022-05-15 NOTE — Progress Notes (Signed)
Chad Reynolds [id number] 1961/08/22 61 y.o.  Subjective:   Patient ID:  Chad Reynolds is a 61 y.o. (DOB August 05, 1961) male.  Chief Complaint:  Chief Complaint  Patient presents with   Follow-up   Depression   Anxiety    Depression        Associated symptoms include no fatigue.  Past medical history includes anxiety.   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  visit was December 2020.  he was doing well.  No meds were changed.  He was taking duloxetine 90 mg, Depakote ER 1500 mg, naltrexone, and stopped Campral.  04/26/20 appt with following noted:  Seen with wife. Both hospitalized with Covid in December.   Fully recovered.  Vaccinated. Work up for fatigue testosterone started.  Not in normal range yet. Some ups and downs lately good and bad without reason. Wife noticing more depression with negative thoughts and dwelling on death.  But mood swings are under control.  No SE he notices.  Wife sees more scratching of himself but he says it's a chronic problem. Really busy working 2 jobs.    Patient reports stable mood and denies irritable moods. He agrees he's more negative and down.   Patient denies any recent difficulty with anxiety.  Patient has occ difficulty with sleep initiation or maintenance. Denies appetite disturbance.  Patient reports that energy and motivation have been good.  Patient denies any difficulty with concentration.  Patient denies any suicidal ideation.  Sometimes crazy dreams. Plan: For depression increase duloxetine to 120 mg daily.  06/28/20 appt with the following noted: Not taking naltrexone he thinks. Is taking duloxetine 120 mg daily and Depakote 1500 mg daily. Working 2 jobs. Helped with increase Duloxetine.  Less tense, more level.  Less down and less negative thinking as often.  Can find some things he enjoys.  Walks with Chad Reynolds.  Working on  losing weight.  Today's F's 60 yo today. Some sleep issues DT nocturia.  02/07/21 appt noted: Jan March higher stress with business and personal.  Wife's family member died suicide.  Another family member died cancer.  F health issues and hosp lately in rehab.  M bad tremors.   Affected him with more down and anxiety, esp more down.  Wonders about increasing Cymbalta.  Handles stress better than the past and not abusing alcohol like in the past.  On duloxetine 120 and Depakote 1500.   Plan: For depression increase duloxetine to 120 mg daily was helpful at the time Continue Depakote 1500 mg daily for irritability Abilify potentiation 2.5 mg AM for 1 week and if no better then 5 mg AM   04-30-2021 appt noted: F died 3 weeks ago. Abilify helped depression and wife noticed.  More mellow and less stressed. Increased to 5 mg without SE. Grief.  Anxiety now more than depression but manageable.  Not overly anxious.  Depakote and Abilify seemed to help the most. Doing well with alcohol. Occ beer without excess now.  Minimal alcohol.  No longer hangs out with friends who drink to excess. Plan: For depression duloxetine to 120 mg daily was helpful at the time Continue Depakote 1500 mg daily for irritability Benefit so continue Abilify potentiation  5 mg AM   08/14/21 appt noted: Doing OK.  Hectic and busier at work. Mood is OK.  A lot more leveled out with less highs and  lows than before. Disc Abilify and switched to night bc thought he was a little tired from it. Plan: For depression duloxetine to 120 mg daily was helpful at the time Start replacing VPA with Abilify to reduce number of meds, polypharmachy. Increase Abilify to 7.5 and reduce Depakote to 1000 mg  Next visit continue transituion if successful  11/14/2021 appt noted: Getting botox for tremor, essential. Not a whole lot of difference.  Not taking Abilify for the last month or so.  Mood has been stable. Plan: For depression duloxetine to  120 mg daily was helpful at the time Continue Depakote to 1000 mg bc irritability is ok on this dose. Will hold Abilify for now since he's not depressed.   05/15/22 appt noted: No med changes. No concerns noted. Still on duloxtine 120 and Depakote 1000 and naltrexone 50. Mood stable and depression also.  Irritability managed and depression.  A little depression here and there.. Energy is better with more activity and exercising 4-5 times per week. No SE Limited alcoholand doesn't think it's a problem.  Past Psychiatric Medication Trials: Duloxetine 120, Lexapro, Viibryd,  Depakote 1500  Review of Systems:  Review of Systems  Constitutional:  Negative for fatigue and unexpected weight change.  Skin:  Positive for rash.  Neurological:  Positive for tremors. Negative for weakness.    Medications: I have reviewed the patient's current medications.  Current Outpatient Medications  Medication Sig Dispense Refill   aspirin 81 MG chewable tablet Chew 81 mg by mouth daily.     Azilsartan-Chlorthalidone (EDARBYCLOR) 40-25 MG TABS Take by mouth daily.     divalproex (DEPAKOTE ER) 500 MG 24 hr tablet Take 3 tablets (1,500 mg total) by mouth daily. (Patient taking differently: Take 1,500 mg by mouth daily. 2 tabs) 270 tablet 0   DULoxetine (CYMBALTA) 60 MG capsule Take 2 capsules (120 mg total) by mouth daily. 60 capsule 0   naltrexone (DEPADE) 50 MG tablet Take 1 tablet (50 mg total) by mouth at bedtime. 90 tablet 1   torsemide (DEMADEX) 20 MG tablet Take 40 mg by mouth daily.     rosuvastatin (CRESTOR) 20 MG tablet Take 20 mg by mouth daily. (Patient not taking: Reported on 11/14/2021)     No current facility-administered medications for this visit.    Medication Side Effects: None  Allergies: No Known Allergies  Past Medical History:  Diagnosis Date   Abnormal involuntary movement 03/07/2021   Androgen deficiency 03/07/2021   Anemia 03/07/2021   Anxiety    Atherosclerosis of coronary  artery 03/07/2021   Atherosclerotic heart disease of native coronary artery without angina pectoris    Benign prostatic hyperplasia with lower urinary tract symptoms 03/07/2021   Bipolar 1 disorder, depressed ([place]) 10/13/2019   Body mass index (BMI) 33.0-33.9, adult 03/07/2021   Coronary atherosclerosis due to calcified coronary lesion    COVID-19 virus infection 10/14/2019   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   Dry skin 03/07/2021   Dyspnea on exertion 03/07/2021   Eczema 03/07/2021   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   Edema of lower extremity 03/07/2021   Encounter for therapeutic drug level monitoring 03/07/2021   Essential hypertension    Essential tremor 02/12/2018   EtOH dependence ([place]) 11/04/2018   Generalized anxiety disorder 03/07/2021   History of adenomatous polyp of colon 03/07/2021   History of infectious disease 03/07/2021   History of syncope 03/07/2021   Hypercalcemia 05/06/2021   Hypertension    Hypotension 10/14/2019  Lymphedema 05/06/2021   Nocturia 03/07/2021   Orthostatic hypotension 10/14/2019   Other specified abnormal findings of blood chemistry 05/06/2021   Prediabetes 03/07/2021   Pure hypercholesterolemia    Recurrent depression ([place]) 03/07/2021   Tinea pedis 03/07/2021    Family History  Problem Relation Age of Onset   Tremor Mother    Diabetes Mother    Diabetes Father    Atrial fibrillation Father    Heart disease Father    Lymphoma Father    Tremor Maternal Grandmother    Heart disease Paternal Grandfather     Social History   Socioeconomic History   Marital status: Married    Spouse name: Not on file   Number of children: 2   Years of education: BA   Highest education level: Not on file  Occupational History   Occupation: [employer]  Tobacco Use   Smoking status: Never   Smokeless tobacco: Never  Vaping Use   Vaping Use: Never used  Substance and Sexual Activity   Alcohol use: Not Currently   Drug use: No   Sexual  activity: Not Currently  Other Topics Concern   Not on file  Social History Narrative   Lives with wife   Caffeine use: daily   Right handed       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   Please see review of systems for further details on the patient's review from today.   Objective:   Physical Exam:  There were no vitals taken for this visit.  Physical Exam Constitutional:      General: He is not in acute distress.    Appearance: He is well-developed.  Musculoskeletal:        General: No deformity.  Neurological:     Mental Status: He is alert and oriented to person, place, and time.     Coordination: Coordination normal.  Psychiatric:        Attention and Perception: He is attentive. He does not perceive auditory hallucinations.        Mood and Affect: Mood is not anxious or depressed. Affect is not labile, blunt or angry.        Speech: Speech normal. Speech is not rapid and pressured.        Behavior: Behavior normal.        Thought Content: Thought content normal. Thought content is not delusional. Thought content does not include homicidal or suicidal ideation. Thought content does not include suicidal plan.        Cognition and Memory: Cognition normal.        Judgment: Judgment normal.     Comments: Insight intact. No auditory or visual hallucinations. No delusions.  Depression managed.     Lab Review:     Component Value Date/Time   NA 140 12/14/2019 0815   K 3.7 12/14/2019 0815   CL 100 12/14/2019 0815   CO2 30 12/14/2019 0815   GLUCOSE 99 12/14/2019 0815   BUN 18 12/14/2019 0815    CREATININE 1.00 12/14/2019 0815   CALCIUM 9.2 12/14/2019 0815   PROT 7.0 12/14/2019 0815  ALBUMIN 4.1 12/14/2019 0815   AST 16 12/14/2019 0815   ALT 20 12/14/2019 0815   ALKPHOS 45 12/14/2019 0815   BILITOT 0.4 12/14/2019 0815   GFRNONAA >60 12/14/2019 0815   GFRAA >60 12/14/2019 0815       Component Value Date/Time   WBC 8.3 03/03/2021 1925   RBC 4.45 03/03/2021 1925   HGB 13.8 03/03/2021 1925   HGB 14.0 12/14/2019 0815   HCT 39.4 03/03/2021 1925   PLT 209 03/03/2021 1925   PLT 242 12/14/2019 0815   MCV 88.5 03/03/2021 1925   MCH 31.0 03/03/2021 1925   MCHC 35.0 03/03/2021 1925   RDW 11.8 03/03/2021 1925   LYMPHSABS 3.2 03/03/2021 1925   MONOABS 0.7 03/03/2021 1925   EOSABS 0.2 03/03/2021 1925   BASOSABS 0.1 03/03/2021 1925    No results found for: "POCLITH", "LITHIUM"   No results found for: "PHENYTOIN", "PHENOBARB", "VALPROATE", "CBMZ"   .res Assessment: Plan:    There are no diagnoses linked to this encounter.  Chad Reynolds has had a good response to a combination of duloxetine plus Depakote.  Depakote was added for irritability which was not adequately addressed by the duloxetine.  His depression is again under control.  His irritability is under control.  His wife has had a lot of input and she agrees with a good response.  For depression duloxetine to 120 mg daily was helpful at the time Continue Depakote to 1000 mg bc irritability is ok on this dose. Will hold Abilify for now since he's not depressed.  He's been fine off it.  Chad Reynolds is also been able to greatly reduce his alcohol intake and appears to no longer be abusing alcohol.  His wife would like him to completely stop which she has not done but he is able to go for days sometimes weeks at a time with no alcohol.  Discussed potential metabolic side effects associated with atypical antipsychotics, as well as potential risk for movement side effects. Advised pt to contact office if movement side effects  occur.  Follow-up 97mo  CLynder ParentsMD, DFAPA  Please see After Visit Summary for patient specific instructions.  No future appointments.  No orders of the defined types were placed in this encounter.    -------------------------------

## 2022-06-27 ENCOUNTER — Other Ambulatory Visit: Payer: Self-pay | Admitting: Cardiology

## 2022-06-27 DIAGNOSIS — R609 Edema, unspecified: Secondary | ICD-10-CM

## 2022-08-27 NOTE — Progress Notes (Unsigned)
Cardiology Office Note:   Date:  08/28/2022  NAME:  Chad Reynolds    MRN: [id number] DOB:  05-09-1961   PCP:  Deland Pretty, MD  Cardiologist:  None  Electrophysiologist:  None   Referring MD: Deland Pretty, MD   Chief Complaint  Patient presents with   Follow-up   History of Present Illness:   Chad Reynolds is a 61 y.o. male with a hx of elevated coronary calcium, hyperlipidemia who presents for follow-up.  Reports he is doing well.  Blood pressure is a bit low today.  He reports this is unusual.  He does not feel dizzy or lightheaded.  No chest pain or trouble breathing.  He is still taking torsemide for venous insufficiency.  No issues.  Cholesterol level was at goal last year.  Exercising daily.  No limitations.  Problem List CAD -CAC score 80 (70th percentile) -Total cholesterol 137, HDL 54, LDL 58, triglycerides 143 2. HTN 3. Venous insufficiency (CFV reflux)  Past Medical History: Past Medical History:  Diagnosis Date   Abnormal involuntary movement 03/07/2021   Androgen deficiency 03/07/2021   Anemia 03/07/2021   Anxiety    Atherosclerosis of coronary artery 03/07/2021   Atherosclerotic heart disease of native coronary artery without angina pectoris    Benign prostatic hyperplasia with lower urinary tract symptoms 03/07/2021   Bipolar 1 disorder, depressed ([place]) 10/13/2019   Body mass index (BMI) 33.0-33.9, adult 03/07/2021   Coronary atherosclerosis due to calcified coronary lesion    COVID-19 virus infection 10/14/2019   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   Dry skin 03/07/2021   Dyspnea on exertion 03/07/2021   Eczema 03/07/2021   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   Edema of lower extremity 03/07/2021   Encounter for therapeutic drug level monitoring 03/07/2021   Essential hypertension    Essential tremor 02/12/2018   EtOH dependence ([place]) 11/04/2018   Generalized anxiety disorder 03/07/2021   History of adenomatous polyp of colon 03/07/2021    History of infectious disease 03/07/2021   History of syncope 03/07/2021   Hypercalcemia 05/06/2021   Hypertension    Hypotension 10/14/2019   Lymphedema 05/06/2021   Nocturia 03/07/2021   Orthostatic hypotension 10/14/2019   Other specified abnormal findings of blood chemistry 05/06/2021   Prediabetes 03/07/2021   Pure hypercholesterolemia    Recurrent depression ([place]) 03/07/2021   Tinea pedis 03/07/2021    Past Surgical History: Past Surgical History:  Procedure Laterality Date   CATARACT EXTRACTION Bilateral     Current Medications: Current Meds  Medication Sig   aspirin 81 MG chewable tablet Chew 81 mg by mouth daily.   Azilsartan-Chlorthalidone (EDARBYCLOR) 40-25 MG TABS Take by mouth daily.   divalproex (DEPAKOTE ER) 500 MG 24 hr tablet Take 2 tablets (1,000 mg total) by mouth daily.   DULoxetine (CYMBALTA) 60 MG capsule Take 2 capsules (120 mg total) by mouth daily.   naltrexone (DEPADE) 50 MG tablet Take 1 tablet (50 mg total) by mouth at bedtime.     Allergies:    Patient has no known allergies.   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  ROS:   All other ROS reviewed and negative. Pertinent positives noted in the HPI.     EKGs/Labs/Other Studies Reviewed:   The following studies were personally reviewed by me today:  Recent Labs: No results found for requested labs within last 365 days.   Recent Lipid Panel    Component Value Date/Time   TRIG 117 10/13/2019 2355    Physical Exam:   VS:  BP 90/60   Pulse (!) 104   Resp (!) 95   Ht '5\' 10"'$  (1.778 m)   Wt 219 lb 6.4 oz (99.5 kg)   BMI 31.48 kg/m    Wt Readings from Last 3 Encounters:  08/28/22 219 lb 6.4 oz (99.5 kg)  08/09/21 225 lb (102.1 kg)  08/05/21 235 lb 3.2 oz (106.7 kg)    General: Well nourished, well developed, in no acute distress Head: Atraumatic, normal size  Eyes: PEERLA, EOMI  Neck: Supple, no JVD Endocrine: No thryomegaly Cardiac: Normal S1, S2; RRR; no murmurs, rubs, or gallops Lungs: Clear to auscultation bilaterally, no wheezing, rhonchi or rales  Abd: Soft, nontender, no hepatomegaly  Ext: No edema, pulses 2+ Musculoskeletal: No deformities, BUE and BLE strength normal and equal Skin: Warm and dry, no rashes   Neuro: Alert and oriented to person, place, time, and situation, CNII-XII grossly intact, no focal deficits  Psych: Normal mood and affect   ASSESSMENT:   Chad Reynolds is a 61 y.o. male who presents for the following: 1. Coronary artery disease involving native coronary artery of native heart without angina pectoris   2. Agatston coronary artery calcium score less than 100   3. Mixed hyperlipidemia   4. Primary hypertension     PLAN:   1. Coronary artery disease involving native coronary artery of native heart without angina pectoris 2. Agatston coronary artery calcium score less than 100 3. Mixed  hyperlipidemia -History of elevated coronary calcium score.  We will continue aspirin as well as current lipid-lowering agents.  His LDL cholesterol goal is less than 70.  Most recent values are at goal.  No symptoms of angina.  We discussed that all of his numbers are at goal.  We will continue with what he is doing.  He will see [place] back as needed.  We will communicate these findings to his primary care physician who is doing a great job.  4. Primary hypertension -Blood pressure a bit low.  Seems to be out of norm for him.  I have asked him to keep a close eye on this.  If his blood pressure remains persistently low he should come off his blood pressure medication.  He  will follow-up with his primary care physician.      Disposition: Return if symptoms worsen or fail to improve.  Medication Adjustments/Labs and Tests Ordered: Current medicines are reviewed at length with the patient today.  Concerns regarding medicines are outlined above.  No orders of the defined types were placed in this encounter.  No orders of the defined types were placed in this encounter.   Patient Instructions  Medication Instructions:  The current medical regimen is effective;  continue present plan and medications.  *If you need a refill on your cardiac medications before your next appointment, please call your pharmacy*  Follow-Up: At [facility], you and your health needs are our priority.  As part of our continuing mission to provide you with exceptional heart care, we have created designated Provider Care Teams.  These Care Teams include your primary Cardiologist (physician) and Advanced Practice Providers (APPs -  Physician Assistants and Nurse Practitioners) who all work together to provide you with the care you need, when you need it.  We recommend signing up for the patient portal called "MyChart".  Sign up information is provided on this After Visit Summary.  MyChart is used to connect with  patients for Virtual Visits (Telemedicine).  Patients are able to view lab/test results, encounter notes, upcoming appointments, etc.  Non-urgent messages can be sent to your provider as well.   To learn more about what you can do with MyChart, go to [email or web address].    Your next appointment:   As needed  The format for your next appointment:   In Person  Provider:   Eleonore Chiquito, MD          Time Spent with Patient: I have spent a total of 25 minutes with patient reviewing hospital notes, telemetry, EKGs, labs and examining the patient as well as establishing an assessment and plan that was discussed with the patient.  > 50% of time was spent in direct patient care.  Signed, Addison Naegeli. Audie Box, MD, [place]  [address], [place], [place] [address] [phone]  08/28/2022 5:53 PM

## 2022-08-28 ENCOUNTER — Encounter: Payer: Self-pay | Admitting: Cardiovascular Disease

## 2022-08-28 ENCOUNTER — Ambulatory Visit: Payer: 59 | Attending: Cardiovascular Disease | Admitting: Cardiovascular Disease

## 2022-08-28 VITALS — BP 90/60 | HR 104 | Resp 95 | Ht 70.0 in | Wt 219.4 lb

## 2022-08-28 DIAGNOSIS — E782 Mixed hyperlipidemia: Secondary | ICD-10-CM | POA: Diagnosis not present

## 2022-08-28 DIAGNOSIS — R931 Abnormal findings on diagnostic imaging of heart and coronary circulation: Secondary | ICD-10-CM | POA: Diagnosis not present

## 2022-08-28 DIAGNOSIS — I10 Essential (primary) hypertension: Secondary | ICD-10-CM

## 2022-08-28 DIAGNOSIS — I2510 Atherosclerotic heart disease of native coronary artery without angina pectoris: Secondary | ICD-10-CM | POA: Diagnosis not present

## 2022-08-28 NOTE — Patient Instructions (Signed)
Medication Instructions:  The current medical regimen is effective;  continue present plan and medications.  *If you need a refill on your cardiac medications before your next appointment, please call your pharmacy*   Follow-Up: At [facility], you and your health needs are our priority.  As part of our continuing mission to provide you with exceptional heart care, we have created designated Provider Care Teams.  These Care Teams include your primary Cardiologist (physician) and Advanced Practice Providers (APPs -  Physician Assistants and Nurse Practitioners) who all work together to provide you with the care you need, when you need it.  We recommend signing up for the patient portal called "MyChart".  Sign up information is provided on this After Visit Summary.  MyChart is used to connect with patients for Virtual Visits (Telemedicine).  Patients are able to view lab/test results, encounter notes, upcoming appointments, etc.  Non-urgent messages can be sent to your provider as well.   To learn more about what you can do with MyChart, go to [email or web address].    Your next appointment:   As needed  The format for your next appointment:   In Person  Provider:   Olmitz O'Neal, MD        

## 2022-10-13 ENCOUNTER — Other Ambulatory Visit: Payer: Self-pay

## 2022-10-13 ENCOUNTER — Telehealth: Payer: Self-pay | Admitting: Psychiatry

## 2022-10-13 ENCOUNTER — Other Ambulatory Visit (HOSPITAL_COMMUNITY): Payer: Self-pay

## 2022-10-13 DIAGNOSIS — F1021 Alcohol dependence, in remission: Secondary | ICD-10-CM

## 2022-10-13 MED ORDER — NALTREXONE HCL 50 MG PO TABS
50.0000 mg | ORAL_TABLET | Freq: Every day | ORAL | 0 refills | Status: DC
  Filled 2022-10-13: qty 30, 30d supply, fill #0
  Filled 2022-11-15: qty 30, 30d supply, fill #1

## 2022-10-13 NOTE — Telephone Encounter (Signed)
Rx sent 

## 2022-10-13 NOTE — Telephone Encounter (Signed)
Wife called and said that Chad Reynolds has not been able to get his naltrexone for a month now because it is back ordered. Richardson Landry was wondering if a substitute medicine could be sent in . Please call Chad Reynolds at [phone]

## 2022-10-13 NOTE — Telephone Encounter (Signed)
I called [facility] on [address] and they have in [place]. Asked patient if he wanted me to send Rx there or he could call around and he said it was okay to send to [facility].  Sent.

## 2022-11-19 ENCOUNTER — Ambulatory Visit: Payer: 59 | Admitting: Psychiatry

## 2022-11-19 ENCOUNTER — Encounter: Payer: Self-pay | Admitting: Psychiatry

## 2022-11-19 DIAGNOSIS — F3341 Major depressive disorder, recurrent, in partial remission: Secondary | ICD-10-CM | POA: Diagnosis not present

## 2022-11-19 DIAGNOSIS — F411 Generalized anxiety disorder: Secondary | ICD-10-CM

## 2022-11-19 DIAGNOSIS — F1021 Alcohol dependence, in remission: Secondary | ICD-10-CM | POA: Diagnosis not present

## 2022-11-19 MED ORDER — DIVALPROEX SODIUM ER 500 MG PO TB24: 1000.0000 mg | ORAL_TABLET | Freq: Every day | ORAL | 3 refills | Status: DC

## 2022-11-19 MED ORDER — NALTREXONE HCL 50 MG PO TABS: 50.0000 mg | ORAL_TABLET | Freq: Every day | ORAL | 3 refills | Status: DC

## 2022-11-19 MED ORDER — DULOXETINE HCL 60 MG PO CPEP: 120.0000 mg | ORAL_CAPSULE | Freq: Every day | ORAL | 3 refills | Status: DC

## 2022-11-19 NOTE — Progress Notes (Signed)
Chad Reynolds [id number] February 25, 1961 62 y.o.  Subjective:   Patient ID:  Chad Reynolds is a 62 y.o. (DOB 1961/01/30) male.  Chief Complaint:  Chief Complaint  Patient presents with   Follow-up   Depression   Anxiety    Depression        Associated symptoms include no fatigue.  Past medical history includes anxiety.   Anxiety Patient reports no palpitations.     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  visit was December 2020.  he was doing well.  No meds were changed.  He was taking duloxetine 90 mg, Depakote ER 1500 mg, naltrexone, and stopped Campral.  04/26/20 appt with following noted:  Seen with wife. Both hospitalized with Covid in December.   Fully recovered.  Vaccinated. Work up for fatigue testosterone started.  Not in normal range yet. Some ups and downs lately good and bad without reason. Wife noticing more depression with negative thoughts and dwelling on death.  But mood swings are under control.  No SE he notices.  Wife sees more scratching of himself but he says it's a chronic problem. Really busy working 2 jobs.    Patient reports stable mood and denies irritable moods. He agrees he's more negative and down.   Patient denies any recent difficulty with anxiety.  Patient has occ difficulty with sleep initiation or maintenance. Denies appetite disturbance.  Patient reports that energy and motivation have been good.  Patient denies any difficulty with concentration.  Patient denies any suicidal ideation.  Sometimes crazy dreams. Plan: For depression increase duloxetine to 120 mg daily.  06/28/20 appt with the following noted: Not taking naltrexone he thinks. Is taking duloxetine 120 mg daily and Depakote 1500 mg daily. Working 2 jobs. Helped with increase Duloxetine.  Less tense, more level.  Less down and less negative thinking as often.  Can find some things he enjoys.   Walks with [name].  Working on losing weight.  Today's F's 61 yo today. Some sleep issues DT nocturia.  02/07/21 appt noted: Jan March higher stress with business and personal.  Wife's family member died suicide.  Another family member died cancer.  F health issues and hosp lately in rehab.  M bad tremors.   Affected him with more down and anxiety, esp more down.  Wonders about increasing Cymbalta.  Handles stress better than the past and not abusing alcohol like in the past.  On duloxetine 120 and Depakote 1500.   Plan: For depression increase duloxetine to 120 mg daily was helpful at the time Continue Depakote 1500 mg daily for irritability Abilify potentiation 2.5 mg AM for 1 week and if no better then 5 mg AM   2021/05/08 appt noted: F died 3 weeks ago. Abilify helped depression and wife noticed.  More mellow and less stressed. Increased to 5 mg without SE. Grief.  Anxiety now more than depression but manageable.  Not overly anxious.  Depakote and Abilify seemed to help the most. Doing well with alcohol. Occ beer without excess now.  Minimal alcohol.  No longer hangs out with friends who drink to excess. Plan: For depression duloxetine to 120 mg daily was helpful at the time Continue Depakote 1500 mg daily for irritability Benefit so continue Abilify potentiation  5 mg AM   08/14/21 appt noted: Doing OK.  Hectic and busier at work. Mood is OK.  A lot more leveled out  with less highs and lows than before. Disc Abilify and switched to night bc thought he was a little tired from it. Plan: For depression duloxetine to 120 mg daily was helpful at the time Start replacing VPA with Abilify to reduce number of meds, polypharmachy. Increase Abilify to 7.5 and reduce Depakote to 1000 mg  Next visit continue transituion if successful  11/14/2021 appt noted: Getting botox for tremor, essential. Not a whole lot of difference.  Not taking Abilify for the last month or so.  Mood has been stable. Plan: For  depression duloxetine to 120 mg daily was helpful at the time Continue Depakote to 1000 mg bc irritability is ok on this dose. Will hold Abilify for now since he's not depressed.   05/15/22 appt noted: No med changes. No concerns noted. Still on duloxtine 120 and Depakote 1000 and naltrexone 50. Mood stable and depression also.  Irritability managed and depression.  A little depression here and there.. Energy is better with more activity and exercising 4-5 times per week. No SE Limited alcoholand doesn't think it's a problem. Plan: For depression duloxetine to 120 mg daily was helpful at the time Continue Depakote to 1000 mg bc irritability is ok on this dose. Will hold Abilify for now since he's not depressed.  Continue naltrexone  11/19/22 appt noted: Shortage of naltrexone. Still on Depakote ER 1000 mg daily, duloxetine 120 mg daily. Pretty good with meds.  Holidays fun and hectic.  Busy time of year.  No real complaints.  Anxiety manageable. Levels things out.  Satisfied with dose.   Saifsified. Only issue occ is sleep.  Sometimes not with anxiety.  Past Psychiatric Medication Trials: Duloxetine 120, Lexapro, Viibryd,  Depakote 1500 Abilify 7.5 mg  Review of Systems:  Review of Systems  Constitutional:  Negative for fatigue and unexpected weight change.  Cardiovascular:  Negative for palpitations.  Skin:  Positive for rash.  Neurological:  Positive for tremors. Negative for weakness.    Medications: I have reviewed the patient's current medications.  Current Outpatient Medications  Medication Sig Dispense Refill   aspirin 81 MG chewable tablet Chew 81 mg by mouth daily.     Azilsartan-Chlorthalidone (EDARBYCLOR) 40-25 MG TABS Take by mouth daily.     divalproex (DEPAKOTE ER) 500 MG 24 hr tablet Take 2 tablets (1,000 mg total) by mouth daily. 180 tablet 3   DULoxetine (CYMBALTA) 60 MG capsule Take 2 capsules (120 mg total) by mouth daily. 180 capsule 3   naltrexone  (DEPADE) 50 MG tablet Take 1 tablet (50 mg total) by mouth at bedtime. 90 tablet 3   rosuvastatin (CRESTOR) 20 MG tablet Take 20 mg by mouth daily. (Patient not taking: Reported on 11/14/2021)     torsemide (DEMADEX) 20 MG tablet TAKE TWO TABLETS BY MOUTH DAILY in THE afternoon (Patient not taking: Reported on 08/28/2022) 180 tablet 0   No current facility-administered medications for this visit.    Medication Side Effects: None  Allergies: No Known Allergies  Past Medical History:  Diagnosis Date   Abnormal involuntary movement 03/07/2021   Androgen deficiency 03/07/2021   Anemia 03/07/2021   Anxiety    Atherosclerosis of coronary artery 03/07/2021   Atherosclerotic heart disease of native coronary artery without angina pectoris    Benign prostatic hyperplasia with lower urinary tract symptoms 03/07/2021   Bipolar 1 disorder, depressed ([place]) 10/13/2019   Body mass index (BMI) 33.0-33.9, adult 03/07/2021   Coronary atherosclerosis due to calcified coronary lesion  COVID-19 virus infection 10/14/2019   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   Dry skin 03/07/2021   Dyspnea on exertion 03/07/2021   Eczema 03/07/2021   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   Edema of lower extremity 03/07/2021   Encounter for therapeutic drug level monitoring 03/07/2021   Essential hypertension    Essential tremor 02/12/2018   EtOH dependence ([place]) 11/04/2018   Generalized anxiety disorder 03/07/2021   History of adenomatous polyp of colon 03/07/2021   History of infectious disease 03/07/2021   History of syncope 03/07/2021   Hypercalcemia 05/06/2021   Hypertension    Hypotension 10/14/2019   Lymphedema 05/06/2021   Nocturia 03/07/2021   Orthostatic hypotension 10/14/2019   Other specified abnormal findings of blood chemistry 05/06/2021   Prediabetes 03/07/2021   Pure hypercholesterolemia    Recurrent depression ([place]) 03/07/2021   Tinea pedis 03/07/2021    Family History  Problem Relation Age  of Onset   Tremor Mother    Diabetes Mother    Diabetes Father    Atrial fibrillation Father    Heart disease Father    Lymphoma Father    Tremor Maternal Grandmother    Heart disease Paternal Grandfather     Social History   Socioeconomic History   Marital status: Married    Spouse name: Not on file   Number of children: 2   Years of education: BA   Highest education level: Not on file  Occupational History   Occupation: [employer]  Tobacco Use   Smoking status: Never   Smokeless tobacco: Never  Vaping Use   Vaping Use: Never used  Substance and Sexual Activity   Alcohol use: Not Currently   Drug use: No   Sexual activity: Not Currently  Other Topics Concern   Not on file  Social History Narrative   Lives with wife   Caffeine use: daily   Right handed       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   Please see review of systems for further details on the patient's review from today.   Objective:   Physical Exam:  There were no vitals taken for this visit.  Physical Exam Constitutional:      General: He is not in acute distress.    Appearance: He is well-developed.  Musculoskeletal:        General: No deformity.  Neurological:     Mental Status: He is alert and oriented to person, place, and time.     Coordination: Coordination normal.  Psychiatric:        Attention and Perception: He is attentive. He does not perceive auditory hallucinations.        Mood and Affect: Mood is not anxious or depressed. Affect is not labile or blunt.        Speech: Speech normal. Speech is not rapid and pressured.         Behavior: Behavior normal.        Thought Content: Thought content normal. Thought content is not delusional. Thought content does not include homicidal or suicidal ideation. Thought content does not  include suicidal plan.        Cognition and Memory: Cognition normal.        Judgment: Judgment normal.     Comments: Insight intact. No auditory or visual hallucinations. No delusions.  Depression managed.     Lab Review:     Component Value Date/Time   NA 140 12/14/2019 0815   K 3.7 12/14/2019 0815   CL 100 12/14/2019 0815   CO2 30 12/14/2019 0815   GLUCOSE 99 12/14/2019 0815   BUN 18 12/14/2019 0815   CREATININE 1.00 12/14/2019 0815   CALCIUM 9.2 12/14/2019 0815   PROT 7.0 12/14/2019 0815   ALBUMIN 4.1 12/14/2019 0815   AST 16 12/14/2019 0815   ALT 20 12/14/2019 0815   ALKPHOS 45 12/14/2019 0815   BILITOT 0.4 12/14/2019 0815   GFRNONAA >60 12/14/2019 0815   GFRAA >60 12/14/2019 0815       Component Value Date/Time   WBC 8.3 03/03/2021 1925   RBC 4.45 03/03/2021 1925   HGB 13.8 03/03/2021 1925   HGB 14.0 12/14/2019 0815   HCT 39.4 03/03/2021 1925   PLT 209 03/03/2021 1925   PLT 242 12/14/2019 0815   MCV 88.5 03/03/2021 1925   MCH 31.0 03/03/2021 1925   MCHC 35.0 03/03/2021 1925   RDW 11.8 03/03/2021 1925   LYMPHSABS 3.2 03/03/2021 1925   MONOABS 0.7 03/03/2021 1925   EOSABS 0.2 03/03/2021 1925   BASOSABS 0.1 03/03/2021 1925    No results found for: "POCLITH", "LITHIUM"   No results found for: "PHENYTOIN", "PHENOBARB", "VALPROATE", "CBMZ"   .res Assessment: Plan:    Mosiah was seen today for follow-up, depression and anxiety.  Diagnoses and all orders for this visit:  Depression, major, recurrent, in partial remission (HCC) -     divalproex (DEPAKOTE ER) 500 MG 24 hr tablet; Take 2 tablets (1,000 mg total) by mouth daily. -     DULoxetine (CYMBALTA) 60 MG capsule; Take 2 capsules (120 mg total) by mouth daily.  Alcohol dependence in remission  (HCC) -     naltrexone (DEPADE) 50 MG tablet; Take 1 tablet (50 mg total) by mouth at bedtime.  Generalized anxiety disorder   Richardson Landry has had a good response to a combination of duloxetine plus Depakote.  Depakote was added for irritability which was not adequately addressed by the duloxetine.  His depression is again under control.  His irritability is under control.  His wife has had a lot of input and she agrees with a good response.  For depression duloxetine to 120 mg daily was helpful at the time Continue Depakote to 1000 mg bc irritability is ok on this dose.  He's been fine off it.  Richardson Landry is also been able to greatly reduce his alcohol intake and appears to no longer be abusing alcohol.  His wife would like him to completely stop which she has not done but he is able to go for days sometimes weeks at a time with no alcohol.  Discussed potential metabolic side effects associated with atypical antipsychotics, as well as potential risk for movement side effects. Advised pt to contact office if movement side effects occur.  Follow-up 12 mo  Lynder Parents MD, DFAPA  Please see After Visit Summary for patient specific instructions.  No future appointments.  No orders of the defined types were placed in this encounter.    -------------------------------

## 2022-12-20 ENCOUNTER — Other Ambulatory Visit (HOSPITAL_COMMUNITY): Payer: Self-pay

## 2022-12-20 MED ORDER — NALTREXONE HCL 50 MG PO TABS
50.0000 mg | ORAL_TABLET | Freq: Every day | ORAL | 3 refills | Status: DC
  Filled 2022-12-20: qty 30, 30d supply, fill #0
  Filled 2023-01-19: qty 30, 30d supply, fill #1
  Filled 2023-02-17: qty 30, 30d supply, fill #2
  Filled 2023-03-20: qty 30, 30d supply, fill #3
  Filled 2023-04-16: qty 30, 30d supply, fill #4
  Filled 2023-05-20 – 2023-05-21 (×2): qty 30, 30d supply, fill #5
  Filled 2023-06-27: qty 30, 30d supply, fill #6
  Filled 2023-07-27: qty 30, 30d supply, fill #7
  Filled 2023-09-08: qty 30, 30d supply, fill #8
  Filled 2023-10-18: qty 30, 30d supply, fill #9

## 2023-02-01 LAB — LAB REPORT - SCANNED
Albumin, Urine POC: 19.4
Creatinine, POC: 147.7 mg/dL
EGFR: 81
Microalb Creat Ratio: 13

## 2023-03-20 ENCOUNTER — Other Ambulatory Visit (HOSPITAL_COMMUNITY): Payer: Self-pay

## 2023-03-24 ENCOUNTER — Telehealth: Payer: Self-pay | Admitting: Psychiatry

## 2023-03-24 ENCOUNTER — Other Ambulatory Visit: Payer: Self-pay | Admitting: Psychiatry

## 2023-03-24 DIAGNOSIS — F3341 Major depressive disorder, recurrent, in partial remission: Secondary | ICD-10-CM

## 2023-03-24 MED ORDER — DIVALPROEX SODIUM ER 500 MG PO TB24: 1500.0000 mg | ORAL_TABLET | Freq: Every day | ORAL | 0 refills | Status: DC

## 2023-03-24 NOTE — Telephone Encounter (Signed)
Last note says 1000 mg, Rx sent same day is for 1000 mg.

## 2023-03-24 NOTE — Telephone Encounter (Signed)
See message from patient.  Patient said he was told he could take 1500 mg. Your last 2 notes and Rx are for 1000 mg, though he did previously take 1500. He said his wife felt like he needed 1500 so for about the past month that is what he has taken. The message left said it was too early to RF, but I called the pharmacy and it went thru and I asked them to fill today. Notified patient. Asked him to let us know if he was taking medication differently than prescribed. Patient feels like 1000 mg is fine, but he took the path of least resistance. F/U 11/2023. Patient had no complaints or needs at this time, other than a RF.

## 2023-03-24 NOTE — Telephone Encounter (Signed)
Let him know. I have no problem with him taking Depakote ER 1500 mg if he feels he needs it.  I sent in new RX to allow that.  He should take the dose that is most effective as long as tolerated.

## 2023-03-24 NOTE — Telephone Encounter (Signed)
LVM to RC 

## 2023-03-24 NOTE — Telephone Encounter (Signed)
Next appt is 11/25/23. Chad Reynolds walked in around lunch saying that [name] said it was too early to fill his Depakote. He told me he has one left for today. His phone number is [phone]. Could someone please call him. He states per Dr. Jennelle Human he is to take 3.   Pharmacy is:  [facility] - [place], [place] - [address]   Phone: [phone]  Fax: [phone]

## 2023-03-25 NOTE — Telephone Encounter (Signed)
Patient notified

## 2023-05-21 ENCOUNTER — Other Ambulatory Visit (HOSPITAL_COMMUNITY): Payer: Self-pay

## 2023-06-07 ENCOUNTER — Other Ambulatory Visit: Payer: Self-pay | Admitting: Cardiovascular Disease

## 2023-06-07 DIAGNOSIS — R609 Edema, unspecified: Secondary | ICD-10-CM

## 2023-06-30 ENCOUNTER — Other Ambulatory Visit (HOSPITAL_COMMUNITY): Payer: Self-pay

## 2023-07-28 ENCOUNTER — Other Ambulatory Visit (HOSPITAL_COMMUNITY): Payer: Self-pay

## 2023-08-27 LAB — LAB REPORT - SCANNED
Creatinine, POC: 26.2 mg/dL
EGFR: 73

## 2023-09-08 ENCOUNTER — Encounter: Payer: Self-pay | Admitting: Nephrology

## 2023-09-12 ENCOUNTER — Other Ambulatory Visit (HOSPITAL_COMMUNITY): Payer: Self-pay

## 2023-10-13 NOTE — Progress Notes (Unsigned)
  Cardiology Office Note:  .   Date:  10/13/2023  ID:  Chad Reynolds, DOB 27-Nov-1960, MRN [id number] PCP: Merri Brunette, MD  [facility] Providers Cardiologist:  None { Click to update primary MD,subspecialty MD or APP then REFRESH:1}   History of Present Illness: .   Chad Reynolds is a 62 y.o. male with history of CAD, HLD, HTN, venous insufficiency who presents for follow-up.      Problem List CAD -CAC score 80 (70th percentile) -Total cholesterol 137, HDL 54, LDL 58, triglycerides 143 2. HTN 3. Venous insufficiency (CFV reflux)    ROS: All other ROS reviewed and negative. Pertinent positives noted in the HPI.     Studies Reviewed: Marland Kitchen       Physical Exam:   VS:  There were no vitals taken for this visit.   Wt Readings from Last 3 Encounters:  08/28/22 219 lb 6.4 oz (99.5 kg)  08/09/21 225 lb (102.1 kg)  08/05/21 235 lb 3.2 oz (106.7 kg)    GEN: Well nourished, well developed in no acute distress NECK: No JVD; No carotid bruits CARDIAC: ***RRR, no murmurs, rubs, gallops RESPIRATORY:  Clear to auscultation without rales, wheezing or rhonchi  ABDOMEN: Soft, non-tender, non-distended EXTREMITIES:  No edema; No deformity  ASSESSMENT AND PLAN: .   ***    {Are you ordering a CV Procedure (e.g. stress test, cath, DCCV, TEE, etc)?   Press F2        :[id number]}   Follow-up: No follow-ups on file.  Time Spent with Patient: I have spent a total of *** minutes caring for this patient today face to face, ordering and reviewing labs/tests, reviewing prior records/medical history, examining the patient, establishing an assessment and plan, communicating results/findings to the patient/family, and documenting in the medical record.   Signed, Lenna Gilford. Flora Lipps, MD, [facility]  [facility]  [address] [place], [place] [address] [phone]  12:42 PM

## 2023-10-15 ENCOUNTER — Ambulatory Visit: Payer: 59 | Admitting: Cardiovascular Disease

## 2023-10-15 DIAGNOSIS — R931 Abnormal findings on diagnostic imaging of heart and coronary circulation: Secondary | ICD-10-CM

## 2023-10-15 DIAGNOSIS — I2510 Atherosclerotic heart disease of native coronary artery without angina pectoris: Secondary | ICD-10-CM

## 2023-10-15 DIAGNOSIS — E782 Mixed hyperlipidemia: Secondary | ICD-10-CM

## 2023-10-15 DIAGNOSIS — I10 Essential (primary) hypertension: Secondary | ICD-10-CM

## 2023-10-23 ENCOUNTER — Other Ambulatory Visit (HOSPITAL_COMMUNITY): Payer: Self-pay

## 2023-10-28 ENCOUNTER — Other Ambulatory Visit: Payer: Self-pay | Admitting: Registered Nurse

## 2023-10-28 DIAGNOSIS — R29898 Other symptoms and signs involving the musculoskeletal system: Secondary | ICD-10-CM

## 2023-11-25 ENCOUNTER — Encounter: Payer: Self-pay | Admitting: Psychiatry

## 2023-11-25 ENCOUNTER — Ambulatory Visit: Payer: 59 | Admitting: Psychiatry

## 2023-11-25 DIAGNOSIS — F411 Generalized anxiety disorder: Secondary | ICD-10-CM | POA: Diagnosis not present

## 2023-11-25 DIAGNOSIS — F3341 Major depressive disorder, recurrent, in partial remission: Secondary | ICD-10-CM | POA: Diagnosis not present

## 2023-11-25 DIAGNOSIS — F4321 Adjustment disorder with depressed mood: Secondary | ICD-10-CM

## 2023-11-25 DIAGNOSIS — F1021 Alcohol dependence, in remission: Secondary | ICD-10-CM | POA: Diagnosis not present

## 2023-11-25 DIAGNOSIS — F5105 Insomnia due to other mental disorder: Secondary | ICD-10-CM

## 2023-11-25 MED ORDER — NALTREXONE HCL 50 MG PO TABS: 50.0000 mg | ORAL_TABLET | Freq: Every day | ORAL | 3 refills | Status: DC

## 2023-11-25 MED ORDER — TRAZODONE HCL 50 MG PO TABS: ORAL_TABLET | ORAL | 2 refills | Status: AC

## 2023-11-25 MED ORDER — DIVALPROEX SODIUM ER 500 MG PO TB24: 1500.0000 mg | ORAL_TABLET | Freq: Every day | ORAL | 0 refills | Status: DC

## 2023-11-25 MED ORDER — DULOXETINE HCL 60 MG PO CPEP: 120.0000 mg | ORAL_CAPSULE | Freq: Every day | ORAL | 3 refills | Status: DC

## 2023-11-25 NOTE — Progress Notes (Signed)
 Chad Reynolds [id number] [birth year] 63 y.o.  Subjective:   Patient ID:  Chad Reynolds is a 63 y.o. (DOB 1960/12/29) male.  Chief Complaint:  Chief Complaint  Patient presents with   Follow-up   Depression   Anxiety    Depression        Associated symptoms include no fatigue.  Past medical history includes anxiety.   Anxiety Patient reports no palpitations.     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  visit was December 2020.  he was doing well.  No meds were changed.  He was taking duloxetine  90 mg, Depakote  ER 1500 mg, naltrexone , and stopped Campral .  04/26/20 appt with following noted:  Seen with wife. Both hospitalized with Covid in December.   Fully recovered.  Vaccinated. Work up for fatigue testosterone started.  Not in normal range yet. Some ups and downs lately good and bad without reason. Wife noticing more depression with negative thoughts and dwelling on death.  But mood swings are under control.  No SE he notices.  Wife sees more scratching of himself but he says it's a chronic problem. Really busy working 2 jobs.    Patient reports stable mood and denies irritable moods. He agrees he's more negative and down.   Patient denies any recent difficulty with anxiety.  Patient has occ difficulty with sleep initiation or maintenance. Denies appetite disturbance.  Patient reports that energy and motivation have been good.  Patient denies any difficulty with concentration.  Patient denies any suicidal ideation.  Sometimes crazy dreams. Plan: For depression increase duloxetine  to 120 mg daily.  06/28/20 appt with the following noted: Not taking naltrexone  he thinks. Is taking duloxetine  120 mg daily and Depakote  1500 mg daily. Working 2 jobs. Helped with increase Duloxetine .  Less tense, more level.  Less down and less negative thinking as often.  Can find some things he enjoys.   Walks with Chad Reynolds.  Working on losing weight.  Today's F's 63 yo today. Some sleep issues DT nocturia.  02/07/21 appt noted: Jan March higher stress with business and personal.  Wife's family member died suicide.  Another family member died cancer.  F health issues and hosp lately in rehab.  M bad tremors.   Affected him with more down and anxiety, esp more down.  Wonders about increasing Cymbalta .  Handles stress better than the past and not abusing alcohol like in the past.  On duloxetine  120 and Depakote  1500.   Plan: For depression increase duloxetine  to 120 mg daily was helpful at the time Continue Depakote  1500 mg daily for irritability Abilify  potentiation 2.5 mg AM for 1 week and if no better then 5 mg AM   Apr 22, 2021 appt noted: F died 3 weeks ago. Abilify  helped depression and wife noticed.  More mellow and less stressed. Increased to 5 mg without SE. Grief.  Anxiety now more than depression but manageable.  Not overly anxious.  Depakote  and Abilify  seemed to help the most. Doing well with alcohol. Occ beer without excess now.  Minimal alcohol.  No longer hangs out with friends who drink to excess. Plan: For depression duloxetine  to 120 mg daily was helpful at the time Continue Depakote  1500 mg daily for irritability Benefit so continue Abilify  potentiation  5 mg AM   08/14/21 appt noted: Doing OK.  Hectic and busier at work. Mood is OK.  A lot more leveled out  with less highs and lows than before. Disc Abilify  and switched to night bc thought he was a little tired from it. Plan: For depression duloxetine  to 120 mg daily was helpful at the time Start replacing VPA with Abilify  to reduce number of meds, polypharmachy. Increase Abilify  to 7.5 and reduce Depakote  to 1000 mg  Next visit continue transituion if successful  11/14/2021 appt noted: Getting botox for tremor, essential. Not a whole lot of difference.  Not taking Abilify  for the last month or so.  Mood has been stable. Plan: For  depression duloxetine  to 120 mg daily was helpful at the time Continue Depakote  to 1000 mg bc irritability is ok on this dose. Will hold Abilify  for now since he's not depressed.   05/15/22 appt noted: No med changes. No concerns noted. Still on duloxtine 120 and Depakote  1000 and naltrexone  50. Mood stable and depression also.  Irritability managed and depression.  A little depression here and there.. Energy is better with more activity and exercising 4-5 times per week. No SE Limited alcoholand doesn't think it's a problem. Plan: For depression duloxetine  to 120 mg daily was helpful at the time Continue Depakote  to 1000 mg bc irritability is ok on this dose. Will hold Abilify  for now since he's not depressed.  Continue naltrexone   11/19/22 appt noted: Shortage of naltrexone . Still on Depakote  ER 1000 mg daily, duloxetine  120 mg daily. Pretty good with meds.  Holidays fun and hectic.  Busy time of year.  No real complaints.  Anxiety manageable. Levels things out.  Satisfied with dose.   Saifsified. Only issue occ is sleep.  Sometimes not with anxiety.  11/28/2023 appt noted: Wife died 05-21-2024in her sleep out of the blue. Rough year.   Tries to take things day by day.  D moved in with himfor now which has been helpful. 03/24/23 called and asked about taking Depakote  ER 1500 mg HS.  But went back to 1000 mg HS. Continues duloxetine  120 daily, naltrexone  50 mg daily propranolol Awakens 2-3 times per night.   No sleepers.  Total sleep good night 5 hours.   Took 6 mos dealing with estate after she died.  But it is done.  Miix of grief and some depression.  OK with alcohol.  Increased some.  Some time on hands he didn't have.  Goes to bar regularly after work for social interaction.  Not incresed dramatically.   Wife ws very organized and had a book which told him what to do if something happened to her.   Past Psychiatric Medication Trials: Duloxetine  120, Lexapro, Viibryd,  Depakote   1500 Abilify  7.5 mg  Fraternal twins boy and girl.  Review of Systems:  Review of Systems  Constitutional:  Negative for fatigue and unexpected weight change.  Cardiovascular:  Negative for palpitations.  Skin:  Positive for rash.  Neurological:  Positive for tremors. Negative for weakness.  Psychiatric/Behavioral:  Positive for dysphoric mood and sleep disturbance.     Medications: I have reviewed the patient's current medications.  Current Outpatient Medications  Medication Sig Dispense Refill   aspirin 81 MG chewable tablet Chew 81 mg by mouth daily.     Azilsartan-Chlorthalidone  (EDARBYCLOR) 40-25 MG TABS Take by mouth daily.     naltrexone  (DEPADE) 50 MG tablet Take 1 tablet (50 mg total) by mouth at bedtime. 90 tablet 3   rosuvastatin (CRESTOR) 20 MG tablet Take 20 mg by mouth daily.     tamsulosin (FLOMAX) 0.4 MG CAPS  capsule Take 0.4 mg by mouth at bedtime.     torsemide  (DEMADEX ) 20 MG tablet TAKE TWO TABLETS BY MOUTH DAILY EVERY AFTERNOON 180 tablet 0   traZODone  (DESYREL ) 50 MG tablet 1-2 tablets at night for sleep 60 tablet 2   divalproex  (DEPAKOTE  ER) 500 MG 24 hr tablet Take 3 tablets (1,500 mg total) by mouth daily. 270 tablet 0   DULoxetine  (CYMBALTA ) 60 MG capsule Take 2 capsules (120 mg total) by mouth daily. 180 capsule 3   naltrexone  (DEPADE) 50 MG tablet Take 1 tablet (50 mg total) by mouth at bedtime. 90 tablet 3   propranolol (INDERAL) 20 MG tablet Take by mouth.     No current facility-administered medications for this visit.    Medication Side Effects: None  Allergies: No Known Allergies  Past Medical History:  Diagnosis Date   Abnormal involuntary movement 03/07/2021   Androgen deficiency 03/07/2021   Anemia 03/07/2021   Anxiety    Atherosclerosis of coronary artery 03/07/2021   Atherosclerotic heart disease of native coronary artery without angina pectoris    Benign prostatic hyperplasia with lower urinary tract symptoms 03/07/2021   Bipolar 1  disorder, depressed (HCC) 10/13/2019   Body mass index (BMI) 33.0-33.9, adult 03/07/2021   Coronary atherosclerosis due to calcified coronary lesion    COVID-19 virus infection 10/14/2019   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   Dry skin 03/07/2021   Dyspnea on exertion 03/07/2021   Eczema 03/07/2021   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   Edema of lower extremity 03/07/2021   Encounter for therapeutic drug level monitoring 03/07/2021   Essential hypertension    Essential tremor 02/12/2018   EtOH dependence (HCC) 11/04/2018   Generalized anxiety disorder 03/07/2021   History of adenomatous polyp of colon 03/07/2021   History of infectious disease 03/07/2021   History of syncope 03/07/2021   Hypercalcemia 05/06/2021   Hypertension    Hypotension 10/14/2019   Lymphedema 05/06/2021   Nocturia 03/07/2021   Orthostatic hypotension 10/14/2019   Other specified abnormal findings of blood chemistry 05/06/2021   Prediabetes 03/07/2021   Pure hypercholesterolemia    Recurrent depression (HCC) 03/07/2021   Tinea pedis 03/07/2021    Family History  Problem Relation Age of Onset   Tremor Mother    Diabetes Mother    Diabetes Father    Atrial fibrillation Father    Heart disease Father    Lymphoma Father    Tremor Maternal Grandmother    Heart disease Paternal Grandfather     Social History   Socioeconomic History   Marital status: Married    Spouse name: Not on file   Number of children: 2   Years of education: BA   Highest education level: Not on file  Occupational History   Occupation: [employer]  Tobacco Use   Smoking status: Never   Smokeless tobacco: Never  Vaping Use   Vaping status: Never Used  Substance and Sexual Activity   Alcohol use: Not Currently   Drug use: No   Sexual activity: Not Currently  Other Topics Concern   Not on file  Social History Narrative   Lives with wife   Caffeine use: daily   Right handed       Works as a IT trainer   Social  Drivers of Corporate investment banker Strain: Low Risk  (05/04/2023)   Received from [facility]   Overall Financial Resource Strain (CARDIA)    Difficulty of Paying Living Expenses:  Not hard at all  Food Insecurity: No Food Insecurity (05/04/2023)   [facility]   Hunger Vital Sign    Worried About Running Out of Food in the Last Year: Never true    Ran Out of Food in the Last Year: Never true  Transportation Needs: No Transportation Needs (05/04/2023)   Received from [facility] - Transportation    Lack of Transportation (Medical): No    Lack of Transportation (Non-Medical): No  Physical Activity: Sufficiently Active (05/04/2023)   Received from [facility]   Exercise Vital Sign    Days of Exercise per Week: 5 days    Minutes of Exercise per Session: 30 min  Stress: No Stress Concern Present (05/04/2023)   Received from [facility] - Occupational Stress Questionnaire    Feeling of Stress : Not at all  Social Connections: Socially Integrated (05/04/2023)   Received from [facility]   Social Network    How would you rate your social network (family, work, friends)?: Good participation with social networks  Intimate Partner Violence: Not At Risk (10/18/2023)   Received from [facility]   HITS    Over the last 12 months how often did your partner physically hurt you?: Never    Over the last 12 months how often did your partner insult you or talk down to you?: Never    Over the last 12 months how often did your partner threaten you with physical harm?: Never    Over the last 12 months how often did your partner scream or curse at you?: Never    Past Medical History, Surgical history, Social history, and Family history were reviewed and updated as appropriate.   Please see review of systems for further details on the patient's review from today.   Objective:   Physical Exam:  There were no vitals taken  for this visit.  Physical Exam Constitutional:      General: He is not in acute distress.    Appearance: He is well-developed.  Musculoskeletal:        General: No deformity.  Neurological:     Mental Status: He is alert and oriented to person, place, and time.     Coordination: Coordination normal.  Psychiatric:        Attention and Perception: He is attentive. He does not perceive auditory hallucinations.        Mood and Affect: Mood is depressed. Mood is not anxious. Affect is not labile or blunt.        Speech: Speech normal. Speech is not rapid and pressured.        Behavior: Behavior normal.        Thought Content: Thought content normal. Thought content is not delusional. Thought content does not include homicidal or suicidal ideation. Thought content does not include suicidal plan.        Cognition and Memory: Cognition normal.        Judgment: Judgment normal.     Comments: Insight intact. No auditory or visual hallucinations. No delusions.  Depression probably worse with grief.        Lab Review:     Component Value Date/Time   NA 140 12/14/2019 0815   K 3.7 12/14/2019 0815   CL 100 12/14/2019 0815   CO2 30 12/14/2019 0815   GLUCOSE 99 12/14/2019 0815   BUN 18 12/14/2019 0815   CREATININE 1.00 12/14/2019 0815   CALCIUM   9.2 12/14/2019 0815   PROT 7.0 12/14/2019 0815   ALBUMIN 4.1 12/14/2019 0815   AST 16 12/14/2019 0815   ALT 20 12/14/2019 0815   ALKPHOS 45 12/14/2019 0815   BILITOT 0.4 12/14/2019 0815   GFRNONAA >60 12/14/2019 0815   GFRAA >60 12/14/2019 0815       Component Value Date/Time   WBC 8.3 03/03/2021 1925   RBC 4.45 03/03/2021 1925   HGB 13.8 03/03/2021 1925   HGB 14.0 12/14/2019 0815   HCT 39.4 03/03/2021 1925   PLT 209 03/03/2021 1925   PLT 242 12/14/2019 0815   MCV 88.5 03/03/2021 1925   MCH 31.0 03/03/2021 1925   MCHC 35.0 03/03/2021 1925   RDW 11.8 03/03/2021 1925   LYMPHSABS 3.2 03/03/2021 1925   MONOABS 0.7 03/03/2021 1925    EOSABS 0.2 03/03/2021 1925   BASOSABS 0.1 03/03/2021 1925    No results found for: "POCLITH", "LITHIUM"   No results found for: "PHENYTOIN", "PHENOBARB", "VALPROATE", "CBMZ"   .res Assessment: Plan:    Trell was seen today for follow-up, depression and anxiety.  Diagnoses and all orders for this visit:  Depression, major, recurrent, in partial remission (HCC) -     DULoxetine  (CYMBALTA ) 60 MG capsule; Take 2 capsules (120 mg total) by mouth daily. -     divalproex  (DEPAKOTE  ER) 500 MG 24 hr tablet; Take 3 tablets (1,500 mg total) by mouth daily.  Alcohol dependence in remission (HCC) -     naltrexone  (DEPADE) 50 MG tablet; Take 1 tablet (50 mg total) by mouth at bedtime.  Generalized anxiety disorder  Grief  Insomnia due to mental condition -     traZODone  (DESYREL ) 50 MG tablet; 1-2 tablets at night for sleep    Chad Reynolds has had a good response to a combination of duloxetine  plus Depakote .  Depakote  was added for irritability which was not adequately addressed by the duloxetine .  His depression is again under control.  His irritability is under control.    For depression duloxetine  to 120 mg daily was helpful at the time Continue Depakote  to 1000 mg bc irritability is ok on this dose. Naltrexone  50 daily.  Needs more sleep.   Trazodone  not habit forming. 50-100 mg HS. Disc SE and call if a problem.  Chad Reynolds is also been able to greatly reduce his alcohol intake and appears to no longer be abusing alcohol.  Wife died unexpectedly in 05-22-25in her sleep.   Disc grieving.  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    She got him into therapy and psych tx.    Discussed potential metabolic side effects associated with atypical antipsychotics, as well as potential risk for movement side effects. Advised pt to contact office if movement side effects occur.  20 min Counseling:  grief education and signs of it and stages and signes of it not going well and potential for Hospice counseling.    Follow-up 3 mos  Nori Beat MD, DFAPA  Please see After Visit Summary for patient specific instructions.  No future appointments.  No orders of the defined types were placed in this encounter.    -------------------------------

## 2023-12-03 ENCOUNTER — Other Ambulatory Visit: Payer: Self-pay | Admitting: Psychiatry

## 2024-01-19 ENCOUNTER — Other Ambulatory Visit (HOSPITAL_COMMUNITY): Payer: Self-pay

## 2024-02-24 ENCOUNTER — Encounter: Payer: Self-pay | Admitting: Psychiatry

## 2024-02-24 ENCOUNTER — Ambulatory Visit: Payer: 59 | Admitting: Psychiatry

## 2024-02-24 DIAGNOSIS — F1021 Alcohol dependence, in remission: Secondary | ICD-10-CM | POA: Diagnosis not present

## 2024-02-24 DIAGNOSIS — F411 Generalized anxiety disorder: Secondary | ICD-10-CM

## 2024-02-24 DIAGNOSIS — F5105 Insomnia due to other mental disorder: Secondary | ICD-10-CM

## 2024-02-24 DIAGNOSIS — F4321 Adjustment disorder with depressed mood: Secondary | ICD-10-CM | POA: Diagnosis not present

## 2024-02-24 DIAGNOSIS — F3341 Major depressive disorder, recurrent, in partial remission: Secondary | ICD-10-CM | POA: Diagnosis not present

## 2024-02-24 NOTE — Progress Notes (Signed)
 Chad Reynolds [id number] 02-20-1961 63 y.o.  Subjective:   Patient ID:  Chad Reynolds is a 63 y.o. (DOB 05/05/1961) male.  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  visit was December 2020.  he was doing well.  No meds were changed.  He was taking duloxetine 90 mg, Depakote ER 1500 mg, naltrexone, and stopped Campral.  04/26/20 appt with following noted:  Seen with wife. Both hospitalized with Covid in December.   Fully recovered.  Vaccinated. Work up for fatigue testosterone started.  Not in normal range yet. Some ups and downs lately good and bad without reason. Wife noticing more depression with negative thoughts and dwelling on death.  But mood swings are under control.  No SE he notices.  Wife sees more scratching of himself but he says it's a chronic problem. Really busy working 2 jobs.    Patient reports stable mood and denies irritable moods. He agrees he's more negative and down.   Patient denies any recent difficulty with anxiety.  Patient has occ difficulty with sleep initiation or maintenance. Denies appetite disturbance.  Patient reports that energy and motivation have been good.  Patient denies any difficulty with concentration.  Patient denies any suicidal ideation.  Sometimes crazy dreams. Plan: For depression increase duloxetine to 120 mg daily.  06/28/20 appt with the following noted: Not taking naltrexone he thinks. Is taking duloxetine 120 mg daily and Depakote 1500 mg daily. Working 2 jobs. Helped with increase Duloxetine.  Less tense, more level.  Less down and less negative thinking as often.  Can find some things he enjoys.  Walks with [name].  Working on losing weight.  Today's F's 62 yo today. Some sleep issues DT nocturia.  02/07/21 appt noted: Jan March higher stress  with business and personal.  Wife's family member died suicide.  Another family member died cancer.  F health issues and hosp lately in rehab.  M bad tremors.   Affected him with more down and anxiety, esp more down.  Wonders about increasing Cymbalta.  Handles stress better than the past and not abusing alcohol like in the past.  On duloxetine 120 and Depakote 1500.   Plan: For depression increase duloxetine to 120 mg daily was helpful at the time Continue Depakote 1500 mg daily for irritability Abilify potentiation 2.5 mg AM for 1 week and if no better then 5 mg AM   Apr 29, 2021 appt noted: F died 3 weeks ago. Abilify helped depression and wife noticed.  More mellow and less stressed. Increased to 5 mg without SE. Grief.  Anxiety now more than depression but manageable.  Not overly anxious.  Depakote and Abilify seemed to help the most. Doing well with alcohol. Occ beer without excess now.  Minimal alcohol.  No longer hangs out with friends who drink to excess. Plan: For depression duloxetine to 120 mg daily was helpful at the time Continue Depakote 1500 mg daily for irritability Benefit so continue Abilify potentiation  5 mg AM   08/14/21 appt noted: Doing OK.  Hectic and busier at work. Mood is OK.  A lot more leveled out with less highs and lows than before. Disc Abilify and switched to night bc thought he was a little tired from it. Plan: For depression duloxetine to 120 mg daily  was helpful at the time Start replacing VPA with Abilify to reduce number of meds, polypharmachy. Increase Abilify to 7.5 and reduce Depakote to 1000 mg  Next visit continue transituion if successful  11/14/2021 appt noted: Getting botox for tremor, essential. Not a whole lot of difference.  Not taking Abilify for the last month or so.  Mood has been stable. Plan: For depression duloxetine to 120 mg daily was helpful at the time Continue Depakote to 1000 mg bc irritability is ok on this dose. Will hold Abilify  for now since he's not depressed.   05/15/22 appt noted: No med changes. No concerns noted. Still on duloxtine 120 and Depakote 1000 and naltrexone 50. Mood stable and depression also.  Irritability managed and depression.  A little depression here and there.. Energy is better with more activity and exercising 4-5 times per week. No SE Limited alcoholand doesn't think it's a problem. Plan: For depression duloxetine to 120 mg daily was helpful at the time Continue Depakote to 1000 mg bc irritability is ok on this dose. Will hold Abilify for now since he's not depressed.  Continue naltrexone  11/19/22 appt noted: Shortage of naltrexone. Still on Depakote ER 1000 mg daily, duloxetine 120 mg daily. Pretty good with meds.  Holidays fun and hectic.  Busy time of year.  No real complaints.  Anxiety manageable. Levels things out.  Satisfied with dose.   Saifsified. Only issue occ is sleep.  Sometimes not with anxiety.  06-Dec-2023 appt noted: Wife died 2024-06-02in her sleep out of the blue. Rough year.   Tries to take things day by day.  D moved in with himfor now which has been helpful. 03/24/23 called and asked about taking Depakote ER 1500 mg HS.  But went back to 1000 mg HS. Continues duloxetine 120 daily, naltrexone 50 mg daily propranolol Awakens 2-3 times per night.   No sleepers.  Total sleep good night 5 hours.   Took 6 mos dealing with estate after she died.  But it is done.  Miix of grief and some depression.  OK with alcohol.  Increased some.  Some time on hands he didn't have.  Goes to bar regularly after work for social interaction.  Not incresed dramatically.   Wife ws very organized and had a book which told him what to do if something happened to her.   02/24/24 appt noted:  with older B Med: no change, duloxetine 120, Depakote ER 1500, naltrexone 50 Wt gain can be related to Depakote Wife's BD soon and death anniv 2023-03-30.   Seems like peaks are the same but valleys all lower.   Wearing on me and my kids.  Kids caring for him like his wife did which is good and bad.  He has twins, boy and girl.   Only way he can be around people his age is to drink wit friends.  If not home in certain time frame will get texts and phone calls. D will call son to call pt if there are concerns. Older B concerns.  2 kids don't drink and are against drinking.  Kids think his drinking on him casually is wrong.  Prefers to leave work and go out with friends and B thinks what Chad Reynolds is doing is not wrong.    Chad Reynolds says on usual day not excessive drinking but some days too much.  He doesn't believe he's drinking excessively at bars.   D moved in to take care of him.  A little bit of anger irritability in dealing with son.  No such px at work.   Past Psychiatric Medication Trials:  Duloxetine 120, Lexapro, Viibryd,  Depakote 1500 Abilify 7.5 mg  Fraternal twins boy and girl.  Review of Systems:  Review of Systems  Constitutional:  Negative for fatigue and unexpected weight change.  Cardiovascular:  Negative for palpitations.  Skin:  Positive for rash.  Neurological:  Positive for tremors. Negative for weakness.  Psychiatric/Behavioral:  Positive for dysphoric mood and sleep disturbance.     Medications: I have reviewed the patient's current medications.  Current Outpatient Medications  Medication Sig Dispense Refill   aspirin 81 MG chewable tablet Chew 81 mg by mouth daily.     Azilsartan-Chlorthalidone (EDARBYCLOR) 40-25 MG TABS Take by mouth daily.     divalproex (DEPAKOTE ER) 500 MG 24 hr tablet Take 3 tablets (1,500 mg total) by mouth daily. 270 tablet 0   DULoxetine (CYMBALTA) 60 MG capsule Take 2 capsules (120 mg total) by mouth daily. 180 capsule 3   naltrexone (DEPADE) 50 MG tablet Take 1 tablet (50 mg total) by mouth at bedtime. 90 tablet 3   naltrexone (DEPADE) 50 MG tablet Take 1 tablet (50 mg total) by mouth at bedtime. 90 tablet 3   propranolol (INDERAL) 20 MG tablet  Take by mouth.     rosuvastatin (CRESTOR) 20 MG tablet Take 20 mg by mouth daily.     tamsulosin (FLOMAX) 0.4 MG CAPS capsule Take 0.4 mg by mouth at bedtime.     torsemide (DEMADEX) 20 MG tablet TAKE TWO TABLETS BY MOUTH DAILY EVERY AFTERNOON 180 tablet 0   traZODone (DESYREL) 50 MG tablet 1-2 tablets at night for sleep 60 tablet 2   No current facility-administered medications for this visit.    Medication Side Effects: None  Allergies: No Known Allergies  Past Medical History:  Diagnosis Date   Abnormal involuntary movement 03/07/2021   Androgen deficiency 03/07/2021   Anemia 03/07/2021   Anxiety    Atherosclerosis of coronary artery 03/07/2021   Atherosclerotic heart disease of native coronary artery without angina pectoris    Benign prostatic hyperplasia with lower urinary tract symptoms 03/07/2021   Bipolar 1 disorder, depressed (HCC) 10/13/2019   Body mass index (BMI) 33.0-33.9, adult 03/07/2021   Coronary atherosclerosis due to calcified coronary lesion    COVID-19 virus infection 10/14/2019   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   Dry skin 03/07/2021   Dyspnea on exertion 03/07/2021   Eczema 03/07/2021   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   Edema of lower extremity 03/07/2021   Encounter for therapeutic drug level monitoring 03/07/2021   Essential hypertension    Essential tremor 02/12/2018   EtOH dependence (HCC) 11/04/2018   Generalized anxiety disorder 03/07/2021   History of adenomatous polyp of colon 03/07/2021   History of infectious disease 03/07/2021   History of syncope 03/07/2021   Hypercalcemia 05/06/2021   Hypertension    Hypotension 10/14/2019   Lymphedema 05/06/2021   Nocturia 03/07/2021   Orthostatic hypotension 10/14/2019   Other specified abnormal findings of blood chemistry 05/06/2021   Prediabetes 03/07/2021   Pure hypercholesterolemia    Recurrent depression (HCC) 03/07/2021   Tinea pedis 03/07/2021    Family History  Problem Relation  Age of Onset   Tremor Mother    Diabetes Mother    Diabetes Father    Atrial fibrillation Father    Heart disease Father    Lymphoma Father  Tremor Maternal Grandmother    Heart disease Paternal Grandfather     Social History   Socioeconomic History   Marital status: Married    Spouse name: Not on file   Number of children: 2   Years of education: BA   Highest education level: Not on file  Occupational History   Occupation: [employer]  Tobacco Use   Smoking status: Never   Smokeless tobacco: Never  Vaping Use   Vaping status: Never Used  Substance and Sexual Activity   Alcohol use: Not Currently   Drug use: No   Sexual activity: Not Currently  Other Topics Concern   Not on file  Social History Narrative   Lives with wife   Caffeine use: daily   Right handed       Works as a IT trainer   Social Drivers of Corporate investment banker Strain: Low Risk  (05/04/2023)   Received from [facility]   Overall Financial Resource Strain (CARDIA)    Difficulty of Paying Living Expenses: Not hard at all  Food Insecurity: No Food Insecurity (05/04/2023)   Received from [facility]   Hunger Vital Sign    Worried About Running Out of Food in the Last Year: Never true    Ran Out of Food in the Last Year: Never true  Transportation Needs: No Transportation Needs (05/04/2023)   Received from [facility] - Transportation    Lack of Transportation (Medical): No    Lack of Transportation (Non-Medical): No  Physical Activity: Sufficiently Active (05/04/2023)   Received from [facility]   Exercise Vital Sign    Days of Exercise per Week: 5 days    Minutes of Exercise per Session: 30 min  Stress: No Stress Concern Present (05/04/2023)   Received from [facility] - Occupational Stress Questionnaire    Feeling of Stress : Not at all  Social Connections: Socially Integrated (05/04/2023)   Received from [facility]   Social  Network    How would you rate your social network (family, work, friends)?: Good participation with social networks  Intimate Partner Violence: Not At Risk (10/18/2023)   Received from [facility]   HITS    Over the last 12 months how often did your partner physically hurt you?: Never    Over the last 12 months how often did your partner insult you or talk down to you?: Never    Over the last 12 months how often did your partner threaten you with physical harm?: Never    Over the last 12 months how often did your partner scream or curse at you?: Never    Past Medical History, Surgical history, Social history, and Family history were reviewed and updated as appropriate.   Please see review of systems for further details on the patient's review from today.   Objective:   Physical Exam:  There were no vitals taken for this visit.  Physical Exam Constitutional:      General: He is not in acute distress.    Appearance: He is well-developed.  Musculoskeletal:        General: No deformity.  Neurological:     Mental Status: He is alert and oriented to person, place, and time.     Coordination: Coordination normal.  Psychiatric:        Attention and Perception: He is attentive. He does not perceive auditory hallucinations.  Mood and Affect: Mood is depressed. Mood is not anxious. Affect is not labile or blunt.        Speech: Speech normal. Speech is not rapid and pressured.        Behavior: Behavior normal.        Thought Content: Thought content normal. Thought content is not delusional. Thought content does not include homicidal or suicidal ideation. Thought content does not include suicidal plan.        Cognition and Memory: Cognition normal.        Judgment: Judgment normal.     Comments: Insight intact. No auditory or visual hallucinations. No delusions.  Depression probably worse with grief.        Lab Review:     Component Value Date/Time   NA 140 12/14/2019 0815    K 3.7 12/14/2019 0815   CL 100 12/14/2019 0815   CO2 30 12/14/2019 0815   GLUCOSE 99 12/14/2019 0815   BUN 18 12/14/2019 0815   CREATININE 1.00 12/14/2019 0815   CALCIUM 9.2 12/14/2019 0815   PROT 7.0 12/14/2019 0815   ALBUMIN 4.1 12/14/2019 0815   AST 16 12/14/2019 0815   ALT 20 12/14/2019 0815   ALKPHOS 45 12/14/2019 0815   BILITOT 0.4 12/14/2019 0815   GFRNONAA >60 12/14/2019 0815   GFRAA >60 12/14/2019 0815       Component Value Date/Time   WBC 8.3 03/03/2021 1925   RBC 4.45 03/03/2021 1925   HGB 13.8 03/03/2021 1925   HGB 14.0 12/14/2019 0815   HCT 39.4 03/03/2021 1925   PLT 209 03/03/2021 1925   PLT 242 12/14/2019 0815   MCV 88.5 03/03/2021 1925   MCH 31.0 03/03/2021 1925   MCHC 35.0 03/03/2021 1925   RDW 11.8 03/03/2021 1925   LYMPHSABS 3.2 03/03/2021 1925   MONOABS 0.7 03/03/2021 1925   EOSABS 0.2 03/03/2021 1925   BASOSABS 0.1 03/03/2021 1925    No results found for: "POCLITH", "LITHIUM"   No results found for: "PHENYTOIN", "PHENOBARB", "VALPROATE", "CBMZ"   .res Assessment: Plan:    Anthany was seen today for follow-up, depression and anxiety.  Diagnoses and all orders for this visit:  Depression, major, recurrent, in partial remission (HCC)  Generalized anxiety disorder  Alcohol dependence in remission (HCC)  Grief  Insomnia due to mental condition   Chad Reynolds  had a good response to a combination of duloxetine plus Depakote.  Depakote was added for irritability which was not adequately addressed by the duloxetine.  His depression is again under control.  His irritability is under control.    For depression duloxetine to 120 mg daily was helpful at the time Continue Depakote to 1000 mg bc irritability is ok on this dose. Naltrexone 50 daily.  Resume Abilify 2.5 mg to 5 mg for potentiation for depression  Needs more sleep.   Trazodone not habit forming. 50-100 mg HS. Disc SE and call if a problem.  Chad Reynolds is also been able to greatly reduce  his alcohol intake and appears to no longer be abusing alcohol.   But is drinking after work with friends as limited social interaction.   Wife died unexpectedly in 06-04-2025in her sleep.   Disc grieving.  Selena Batten was a Clinical biochemist I the home.    She got him into therapy and psych tx.    Discussed potential metabolic side effects associated with atypical antipsychotics, as well as potential risk for movement side effects. Advised pt to contact office if movement  side effects occur.  30 min Counseling:  grief education and signs of it and stages and signes of it not going well and potential for Hospice counseling.  Disc how to make decision about how to deal with kids who want to help him.  Disc boundary issues with family and encourage him to avoid victim role for his own well-being and dep  Follow-up 2 mos  Nori Beat MD, DFAPA  Please see After Visit Summary for patient specific instructions.  No future appointments.  No orders of the defined types were placed in this encounter.    -------------------------------

## 2024-02-27 ENCOUNTER — Other Ambulatory Visit: Payer: Self-pay | Admitting: Cardiovascular Disease

## 2024-02-27 DIAGNOSIS — R609 Edema, unspecified: Secondary | ICD-10-CM

## 2024-03-10 ENCOUNTER — Telehealth: Payer: Self-pay | Admitting: Psychiatry

## 2024-03-10 NOTE — Telephone Encounter (Signed)
 Patient said script was not sent at 4/16 visit. He didn't know what the medication was, but after reviewing note he said it was the Abilify .  Resume Abilify  2.5 mg to 5 mg for potentiation for depression   I did not send in Rx because wasn't sure if he needed to titrate up from 2.5 for a length of time and then could go to 5 mg if no benefit.   [employer]

## 2024-03-10 NOTE — Telephone Encounter (Signed)
 Pt called 9:20 stating pharmacy never received Rx CC was to send on last visit. Did not know the name. [employer].

## 2024-03-11 NOTE — Telephone Encounter (Signed)
 Abilify  2.5 mg daily for 1 week and if no benefit then increase to 5 mg daily.

## 2024-03-14 NOTE — Telephone Encounter (Signed)
 Patient notified of recommendation with regards to Abilify  dosing.

## 2024-03-15 ENCOUNTER — Telehealth: Payer: Self-pay | Admitting: Psychiatry

## 2024-03-15 ENCOUNTER — Other Ambulatory Visit: Payer: Self-pay

## 2024-03-15 MED ORDER — ARIPIPRAZOLE 5 MG PO TABS: 2.5000 mg | ORAL_TABLET | Freq: Every day | ORAL | 0 refills | Status: DC

## 2024-03-15 NOTE — Telephone Encounter (Signed)
 Chad Reynolds called and LM at 1;45 asking again about the medication not being sent to the pharmacy - [facility]  Please call to let him know if it is going to be sent in.

## 2024-03-15 NOTE — Telephone Encounter (Signed)
 Rx sent and patient notified of Rx and directions.

## 2024-03-16 ENCOUNTER — Other Ambulatory Visit: Payer: Self-pay | Admitting: Cardiovascular Disease

## 2024-03-16 DIAGNOSIS — R609 Edema, unspecified: Secondary | ICD-10-CM

## 2024-03-30 ENCOUNTER — Other Ambulatory Visit: Payer: Self-pay | Admitting: Psychiatry

## 2024-03-30 DIAGNOSIS — F3341 Major depressive disorder, recurrent, in partial remission: Secondary | ICD-10-CM

## 2024-04-15 ENCOUNTER — Other Ambulatory Visit: Payer: Self-pay | Admitting: Psychiatry

## 2024-04-30 ENCOUNTER — Other Ambulatory Visit: Payer: Self-pay | Admitting: Psychiatry

## 2024-04-30 DIAGNOSIS — F3341 Major depressive disorder, recurrent, in partial remission: Secondary | ICD-10-CM

## 2024-05-04 ENCOUNTER — Ambulatory Visit: Admitting: Psychiatry

## 2024-05-04 ENCOUNTER — Encounter: Payer: Self-pay | Admitting: Psychiatry

## 2024-05-04 DIAGNOSIS — F411 Generalized anxiety disorder: Secondary | ICD-10-CM | POA: Diagnosis not present

## 2024-05-04 DIAGNOSIS — F4321 Adjustment disorder with depressed mood: Secondary | ICD-10-CM | POA: Diagnosis not present

## 2024-05-04 DIAGNOSIS — F3341 Major depressive disorder, recurrent, in partial remission: Secondary | ICD-10-CM | POA: Diagnosis not present

## 2024-05-04 DIAGNOSIS — F1021 Alcohol dependence, in remission: Secondary | ICD-10-CM

## 2024-05-04 DIAGNOSIS — F5105 Insomnia due to other mental disorder: Secondary | ICD-10-CM | POA: Diagnosis not present

## 2024-05-04 MED ORDER — ARIPIPRAZOLE 5 MG PO TABS: 5.0000 mg | ORAL_TABLET | Freq: Every day | ORAL | 0 refills | Status: DC

## 2024-05-04 MED ORDER — DIVALPROEX SODIUM ER 500 MG PO TB24: 1500.0000 mg | ORAL_TABLET | Freq: Every day | ORAL | 2 refills | Status: DC

## 2024-05-04 NOTE — Progress Notes (Signed)
 JASHER BARKAN [id number] 05-Nov-1961 63 y.o.  Subjective:   Patient ID:  Chad Reynolds is a 63 y.o. (DOB May 15, 1961) male.  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  visit was December 2020.  he was doing well.  No meds were changed.  He was taking duloxetine  90 mg, Depakote  ER 1500 mg, naltrexone , and stopped Campral .  04/26/20 appt with following noted:  Seen with wife. Both hospitalized with Covid in December.   Fully recovered.  Vaccinated. Work up for fatigue testosterone started.  Not in normal range yet. Some ups and downs lately good and bad without reason. Wife noticing more depression with negative thoughts and dwelling on death.  But mood swings are under control.  No SE he notices.  Wife sees more scratching of himself but he says it's a chronic problem. Really busy working 2 jobs.    Patient reports stable mood and denies irritable moods. He agrees he's more negative and down.   Patient denies any recent difficulty with anxiety.  Patient has occ difficulty with sleep initiation or maintenance. Denies appetite disturbance.  Patient reports that energy and motivation have been good.  Patient denies any difficulty with concentration.  Patient denies any suicidal ideation.  Sometimes crazy dreams. Plan: For depression increase duloxetine  to 120 mg daily.  06/28/20 appt with the following noted: Not taking naltrexone  he thinks. Is taking duloxetine  120 mg daily and Depakote  1500 mg daily. Working 2 jobs. Helped with increase Duloxetine .  Less tense, more level.  Less down and less negative thinking as often.  Can find some things he enjoys.  Walks with [name].  Working on losing weight.  Today's F's 62 yo today. Some sleep issues DT nocturia.  02/07/21 appt noted: Jan  March higher stress with business and personal.  Wife's family member died suicide.  Another family member died cancer.  F health issues and hosp lately in rehab.  M bad tremors.   Affected him with more down and anxiety, esp more down.  Wonders about increasing Cymbalta .  Handles stress better than the past and not abusing alcohol like in the past.  On duloxetine  120 and Depakote  1500.   Plan: For depression increase duloxetine  to 120 mg daily was helpful at the time Continue Depakote  1500 mg daily for irritability Abilify  potentiation 2.5 mg AM for 1 week and if no better then 5 mg AM   2021/04/21 appt noted: F died 3 weeks ago. Abilify  helped depression and wife noticed.  More mellow and less stressed. Increased to 5 mg without SE. Grief.  Anxiety now more than depression but manageable.  Not overly anxious.  Depakote  and Abilify  seemed to help the most. Doing well with alcohol. Occ beer without excess now.  Minimal alcohol.  No longer hangs out with friends who drink to excess. Plan: For depression duloxetine  to 120 mg daily was helpful at the time Continue Depakote  1500 mg daily for irritability Benefit so continue Abilify  potentiation  5 mg AM   08/14/21 appt noted: Doing OK.  Hectic and busier at work. Mood is OK.  A lot more leveled out with less highs and lows than before. Disc Abilify  and switched to night bc thought he was a little tired from it. Plan:  For depression duloxetine  to 120 mg daily was helpful at the time Start replacing VPA with Abilify  to reduce number of meds, polypharmachy. Increase Abilify  to 7.5 and reduce Depakote  to 1000 mg  Next visit continue transituion if successful  11/14/2021 appt noted: Getting botox for tremor, essential. Not a whole lot of difference.  Not taking Abilify  for the last month or so.  Mood has been stable. Plan: For depression duloxetine  to 120 mg daily was helpful at the time Continue Depakote  to 1000 mg bc irritability is ok on this  dose. Will hold Abilify  for now since he's not depressed.   05/15/22 appt noted: No med changes. No concerns noted. Still on duloxtine 120 and Depakote  1000 and naltrexone  50. Mood stable and depression also.  Irritability managed and depression.  A little depression here and there.. Energy is better with more activity and exercising 4-5 times per week. No SE Limited alcoholand doesn't think it's a problem. Plan: For depression duloxetine  to 120 mg daily was helpful at the time Continue Depakote  to 1000 mg bc irritability is ok on this dose. Will hold Abilify  for now since he's not depressed.  Continue naltrexone   11/19/22 appt noted: Shortage of naltrexone . Still on Depakote  ER 1000 mg daily, duloxetine  120 mg daily. Pretty good with meds.  Holidays fun and hectic.  Busy time of year.  No real complaints.  Anxiety manageable. Levels things out.  Satisfied with dose.   Saifsified. Only issue occ is sleep.  Sometimes not with anxiety.  12/23/2023 appt noted: Wife died 27-Jun-2024in her sleep out of the blue. Rough year.   Tries to take things day by day.  D moved in with himfor now which has been helpful. 03/24/23 called and asked about taking Depakote  ER 1500 mg HS.  But went back to 1000 mg HS. Continues duloxetine  120 daily, naltrexone  50 mg daily propranolol Awakens 2-3 times per night.   No sleepers.  Total sleep good night 5 hours.   Took 6 mos dealing with estate after she died.  But it is done.  Miix of grief and some depression.  OK with alcohol.  Increased some.  Some time on hands he didn't have.  Goes to bar regularly after work for social interaction.  Not incresed dramatically.   Wife ws very organized and had a book which told him what to do if something happened to her.   02/24/24 appt noted:  with older B Med: no change, duloxetine  120, Depakote  ER 1500, naltrexone  50 Wt gain can be related to Depakote  Wife's BD soon and death anniv 04/16/2023.   Seems like peaks are the same  but valleys all lower.  Wearing on me and my kids.  Kids caring for him like his wife did which is good and bad.  He has twins, boy and girl.   Only way he can be around people his age is to drink wit friends.  If not home in certain time frame will get texts and phone calls. D will call son to call pt if there are concerns. Older B concerns.  2 kids don't drink and are against drinking.  Kids think his drinking on him casually is wrong.  Prefers to leave work and go out with friends and B thinks what Marcey is doing is not wrong.    Marcey says on usual day not excessive drinking but some days too much.  He doesn't believe he's drinking excessively at bars.   D moved  in to take care of him.   A little bit of anger irritability in dealing with son.  No such px at work.  05/04/24 appt noted: Med: no change, duloxetine  120, Depakote  ER 1500, naltrexone  50, Abilify  5 , trazodone  none Main issue EFA. Abilify  helped.  More calming.  Less down.  It feels right for me.   Bumpy period with anniversary of wife's death and mother's day. More upbeat days than down days.   Family not as intrusive as they were.   D still living with him. No SE.  Past Psychiatric Medication Trials:  Duloxetine  120, Lexapro, Viibryd,  Depakote  1500 Abilify  7.5 mg  Fraternal twins boy and girl.  Review of Systems:  Review of Systems  Constitutional:  Negative for fatigue and unexpected weight change.  Cardiovascular:  Negative for chest pain and palpitations.  Skin:  Positive for rash.  Neurological:  Positive for tremors. Negative for weakness.  Psychiatric/Behavioral:  Positive for dysphoric mood and sleep disturbance.     Medications: I have reviewed the patient's current medications.  Current Outpatient Medications  Medication Sig Dispense Refill   aspirin 81 MG chewable tablet Chew 81 mg by mouth daily.     Azilsartan-Chlorthalidone  (EDARBYCLOR) 40-25 MG TABS Take by mouth daily.     DULoxetine  (CYMBALTA )  60 MG capsule Take 2 capsules (120 mg total) by mouth daily. 180 capsule 3   naltrexone  (DEPADE) 50 MG tablet Take 1 tablet (50 mg total) by mouth at bedtime. 90 tablet 3   naltrexone  (DEPADE) 50 MG tablet Take 1 tablet (50 mg total) by mouth at bedtime. 90 tablet 3   propranolol (INDERAL) 20 MG tablet Take by mouth.     rosuvastatin (CRESTOR) 20 MG tablet Take 20 mg by mouth daily.     tamsulosin (FLOMAX) 0.4 MG CAPS capsule Take 0.4 mg by mouth at bedtime.     torsemide  (DEMADEX ) 20 MG tablet TAKE TWO TABLETS BY MOUTH every afternoon 15 tablet 0   ARIPiprazole  (ABILIFY ) 5 MG tablet Take 1 tablet (5 mg total) by mouth daily. 90 tablet 0   divalproex  (DEPAKOTE  ER) 500 MG 24 hr tablet Take 3 tablets (1,500 mg total) by mouth daily. 90 tablet 2   traZODone  (DESYREL ) 50 MG tablet 1-2 tablets at night for sleep (Patient not taking: Reported on 05/04/2024) 60 tablet 2   No current facility-administered medications for this visit.    Medication Side Effects: None  Allergies: No Known Allergies  Past Medical History:  Diagnosis Date   Abnormal involuntary movement 03/07/2021   Androgen deficiency 03/07/2021   Anemia 03/07/2021   Anxiety    Atherosclerosis of coronary artery 03/07/2021   Atherosclerotic heart disease of native coronary artery without angina pectoris    Benign prostatic hyperplasia with lower urinary tract symptoms 03/07/2021   Bipolar 1 disorder, depressed (HCC) 10/13/2019   Body mass index (BMI) 33.0-33.9, adult 03/07/2021   Coronary atherosclerosis due to calcified coronary lesion    COVID-19 virus infection 10/14/2019   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   Dry skin 03/07/2021   Dyspnea on exertion 03/07/2021   Eczema 03/07/2021   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   Edema of lower extremity 03/07/2021   Encounter for therapeutic drug level monitoring 03/07/2021   Essential hypertension    Essential tremor 02/12/2018   EtOH dependence (HCC) 11/04/2018    Generalized anxiety disorder 03/07/2021   History of adenomatous polyp of colon 03/07/2021   History of infectious disease  03/07/2021   History of syncope 03/07/2021   Hypercalcemia 05/06/2021   Hypertension    Hypotension 10/14/2019   Lymphedema 05/06/2021   Nocturia 03/07/2021   Orthostatic hypotension 10/14/2019   Other specified abnormal findings of blood chemistry 05/06/2021   Prediabetes 03/07/2021   Pure hypercholesterolemia    Recurrent depression (HCC) 03/07/2021   Tinea pedis 03/07/2021    Family History  Problem Relation Age of Onset   Tremor Mother    Diabetes Mother    Diabetes Father    Atrial fibrillation Father    Heart disease Father    Lymphoma Father    Tremor Maternal Grandmother    Heart disease Paternal Grandfather     Social History   Socioeconomic History   Marital status: Married    Spouse name: Not on file   Number of children: 2   Years of education: BA   Highest education level: Not on file  Occupational History   Occupation: [employer]  Tobacco Use   Smoking status: Never   Smokeless tobacco: Never  Vaping Use   Vaping status: Never Used  Substance and Sexual Activity   Alcohol use: Not Currently   Drug use: No   Sexual activity: Not Currently  Other Topics Concern   Not on file  Social History Narrative   Lives with wife   Caffeine use: daily   Right handed       Works as a IT trainer   Social Drivers of Corporate investment banker Strain: Low Risk  (04/23/2024)   Received from [facility]   Overall Financial Resource Strain (CARDIA)    Difficulty of Paying Living Expenses: Not hard at all  Food Insecurity: No Food Insecurity (04/23/2024)   [facility]   Hunger Vital Sign    Within the past 12 months, you worried that your food would run out before you got the money to buy more.: Never true    Within the past 12 months, the food you bought just didn't last and you didn't have money to get more.: Never true  Transportation  Needs: No Transportation Needs (04/23/2024)   Received from [facility] - Transportation    Lack of Transportation (Medical): No    Lack of Transportation (Non-Medical): No  Physical Activity: Insufficiently Active (04/23/2024)   Received from [facility]   Exercise Vital Sign    On average, how many days per week do you engage in moderate to strenuous exercise (like a brisk walk)?: 3 days    On average, how many minutes do you engage in exercise at this level?: 30 min  Stress: No Stress Concern Present (04/23/2024)   Received from [facility] - Occupational Stress Questionnaire    Feeling of Stress : Not at all  Social Connections: Moderately Integrated (04/23/2024)   Received from [facility]   Social Network    How would you rate your social network (family, work, friends)?: Adequate participation with social networks  Intimate Partner Violence: Not At Risk (04/23/2024)   Received from [facility]   HITS    Over the last 12 months how often did your partner physically hurt you?: Never    Over the last 12 months how often did your partner insult you or talk down to you?: Never    Over the last 12 months how often did your partner threaten you with physical harm?: Never    Over the last  12 months how often did your partner scream or curse at you?: Never    Past Medical History, Surgical history, Social history, and Family history were reviewed and updated as appropriate.   Please see review of systems for further details on the patient's review from today.   Objective:   Physical Exam:  There were no vitals taken for this visit.  Physical Exam Constitutional:      General: He is not in acute distress.    Appearance: He is well-developed.   Musculoskeletal:        General: No deformity.   Neurological:     Mental Status: He is alert and oriented to person, place, and time.     Coordination: Coordination normal.    Psychiatric:        Attention and Perception: He is attentive. He does not perceive auditory hallucinations.        Mood and Affect: Mood is depressed. Mood is not anxious. Affect is not labile or blunt.        Speech: Speech normal. Speech is not rapid and pressured.        Behavior: Behavior normal.        Thought Content: Thought content normal. Thought content is not delusional. Thought content does not include homicidal or suicidal ideation. Thought content does not include suicidal plan.        Cognition and Memory: Cognition normal.        Judgment: Judgment normal.     Comments: Insight intact. No auditory or visual hallucinations. No delusions.  Depression probably worse with grief, better with Abilify       Lab Review:     Component Value Date/Time   NA 140 12/14/2019 0815   K 3.7 12/14/2019 0815   CL 100 12/14/2019 0815   CO2 30 12/14/2019 0815   GLUCOSE 99 12/14/2019 0815   BUN 18 12/14/2019 0815   CREATININE 1.00 12/14/2019 0815   CALCIUM  9.2 12/14/2019 0815   PROT 7.0 12/14/2019 0815   ALBUMIN 4.1 12/14/2019 0815   AST 16 12/14/2019 0815   ALT 20 12/14/2019 0815   ALKPHOS 45 12/14/2019 0815   BILITOT 0.4 12/14/2019 0815   GFRNONAA >60 12/14/2019 0815   GFRAA >60 12/14/2019 0815       Component Value Date/Time   WBC 8.3 03/03/2021 1925   RBC 4.45 03/03/2021 1925   HGB 13.8 03/03/2021 1925   HGB 14.0 12/14/2019 0815   HCT 39.4 03/03/2021 1925   PLT 209 03/03/2021 1925   PLT 242 12/14/2019 0815   MCV 88.5 03/03/2021 1925   MCH 31.0 03/03/2021 1925   MCHC 35.0 03/03/2021 1925   RDW 11.8 03/03/2021 1925   LYMPHSABS 3.2 03/03/2021 1925   MONOABS 0.7 03/03/2021 1925   EOSABS 0.2 03/03/2021 1925   BASOSABS 0.1 03/03/2021 1925    No results found for: POCLITH, LITHIUM   No results found for: PHENYTOIN, PHENOBARB, VALPROATE, CBMZ   .res Assessment: Plan:    Chares was seen today for follow-up, depression, anxiety and  stress.  Diagnoses and all orders for this visit:  Depression, major, recurrent, in partial remission (HCC) -     ARIPiprazole  (ABILIFY ) 5 MG tablet; Take 1 tablet (5 mg total) by mouth daily. -     divalproex  (DEPAKOTE  ER) 500 MG 24 hr tablet; Take 3 tablets (1,500 mg total) by mouth daily.  Generalized anxiety disorder  Grief  Insomnia due to mental condition  Alcohol dependence in remission (HCC)  Marcey  had a good response to a combination of duloxetine  plus Depakote .  Depakote  was added for irritability which was not adequately addressed by the duloxetine .  His depression is better with Abilify  but still dealing with grief. His irritability is under control.    For depression duloxetine  to 120 mg daily was helpful at the time Continue Depakote  to 1000 mg bc irritability is ok on this dose. Naltrexone  50 daily.  Benefit Abilify  5 mg for potentiation for depression  Needs more sleep.  Rec trial Trazodone  not habit forming. 50-100 mg HS. Disc SE and call if a problem.  He has not tried it.  Marcey is also been able to greatly reduce his alcohol intake and appears to no longer be abusing alcohol.   But is drinking after work with friends as limited social interaction.    Counseling 20: Wife died unexpectedly in Jun 23, 2024in her sleep.   Disc grieving.  [name] was a Clinical biochemist I the home.    She got him into therapy and psych tx.   Encourage continued social interaction to help depression and grief.  Discussed potential metabolic side effects associated with atypical antipsychotics, as well as potential risk for movement side effects. Advised pt to contact office if movement side effects occur.  Follow-up 2-3 mos  Lorene Macintosh MD, DFAPA  Please see After Visit Summary for patient specific instructions.  No future appointments.  No orders of the defined types were placed in this encounter.    -------------------------------

## 2024-08-03 ENCOUNTER — Ambulatory Visit (INDEPENDENT_AMBULATORY_CARE_PROVIDER_SITE_OTHER): Payer: Self-pay | Admitting: Psychiatry

## 2024-08-03 DIAGNOSIS — Z91199 Patient's noncompliance with other medical treatment and regimen due to unspecified reason: Secondary | ICD-10-CM

## 2024-08-03 NOTE — Progress Notes (Signed)
 No show

## 2024-08-30 ENCOUNTER — Ambulatory Visit (INDEPENDENT_AMBULATORY_CARE_PROVIDER_SITE_OTHER): Admitting: Psychiatry

## 2024-08-30 ENCOUNTER — Encounter: Payer: Self-pay | Admitting: Psychiatry

## 2024-08-30 DIAGNOSIS — F5105 Insomnia due to other mental disorder: Secondary | ICD-10-CM

## 2024-08-30 DIAGNOSIS — Z5181 Encounter for therapeutic drug level monitoring: Secondary | ICD-10-CM | POA: Diagnosis not present

## 2024-08-30 DIAGNOSIS — F411 Generalized anxiety disorder: Secondary | ICD-10-CM

## 2024-08-30 DIAGNOSIS — Z79899 Other long term (current) drug therapy: Secondary | ICD-10-CM

## 2024-08-30 DIAGNOSIS — F3341 Major depressive disorder, recurrent, in partial remission: Secondary | ICD-10-CM | POA: Diagnosis not present

## 2024-08-30 DIAGNOSIS — F4321 Adjustment disorder with depressed mood: Secondary | ICD-10-CM | POA: Diagnosis not present

## 2024-08-30 DIAGNOSIS — F1021 Alcohol dependence, in remission: Secondary | ICD-10-CM

## 2024-08-30 MED ORDER — NALTREXONE HCL 50 MG PO TABS: 50.0000 mg | ORAL_TABLET | Freq: Every day | ORAL | 3 refills | Status: AC

## 2024-08-30 MED ORDER — BUPROPION HCL ER (XL) 150 MG PO TB24: ORAL_TABLET | ORAL | 0 refills | Status: DC

## 2024-08-30 MED ORDER — DIVALPROEX SODIUM ER 500 MG PO TB24: 1500.0000 mg | ORAL_TABLET | Freq: Every day | ORAL | 2 refills | Status: DC

## 2024-08-30 MED ORDER — DULOXETINE HCL 60 MG PO CPEP: 120.0000 mg | ORAL_CAPSULE | Freq: Every day | ORAL | 3 refills | Status: AC

## 2024-08-30 NOTE — Progress Notes (Signed)
 Chad SAENZ [id number] 1960-12-12 63 y.o.  Subjective:   Patient ID:  Chad Reynolds is a 63 y.o. (DOB 1960-11-17) male.  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  visit was December 2020.  he was doing well.  No meds were changed.  He was taking duloxetine  90 mg, Depakote  ER 1500 mg, naltrexone , and stopped Campral .  04/26/20 appt with following noted:  Seen with wife. Both hospitalized with Covid in December.   Fully recovered.  Vaccinated. Work up for fatigue testosterone started.  Not in normal range yet. Some ups and downs lately good and bad without reason. Wife noticing more depression with negative thoughts and dwelling on death.  But mood swings are under control.  No SE he notices.  Wife sees more scratching of himself but he says it's a chronic problem. Really busy working 2 jobs.    Patient reports stable mood and denies irritable moods. He agrees he's more negative and down.   Patient denies any recent difficulty with anxiety.  Patient has occ difficulty with sleep initiation or maintenance. Denies appetite disturbance.  Patient reports that energy and motivation have been good.  Patient denies any difficulty with concentration.  Patient denies any suicidal ideation.  Sometimes crazy dreams. Plan: For depression increase duloxetine  to 120 mg daily.  06/28/20 appt with the following noted: Not taking naltrexone  he thinks. Is taking duloxetine  120 mg daily and Depakote  1500 mg daily. Working 2 jobs. Helped with increase Duloxetine .  Less tense, more level.  Less down and less negative thinking as often.  Can find some things he enjoys.  Walks with [name].  Working on losing weight.  Today's F's 63 yo today. Some sleep issues DT nocturia.  02/07/21 appt noted: Jan  March higher stress with business and personal.  Wife's family member died suicide.  Another family member died cancer.  F health issues and hosp lately in rehab.  M bad tremors.   Affected him with more down and anxiety, esp more down.  Wonders about increasing Cymbalta .  Handles stress better than the past and not abusing alcohol like in the past.  On duloxetine  120 and Depakote  1500.   Plan: For depression increase duloxetine  to 120 mg daily was helpful at the time Continue Depakote  1500 mg daily for irritability Abilify  potentiation 2.5 mg AM for 1 week and if no better then 5 mg AM   Apr 26, 2021 appt noted: F died 3 weeks ago. Abilify  helped depression and wife noticed.  More mellow and less stressed. Increased to 5 mg without SE. Grief.  Anxiety now more than depression but manageable.  Not overly anxious.  Depakote  and Abilify  seemed to help the most. Doing well with alcohol. Occ beer without excess now.  Minimal alcohol.  No longer hangs out with friends who drink to excess. Plan: For depression duloxetine  to 120 mg daily was helpful at the time Continue Depakote  1500 mg daily for irritability Benefit so continue Abilify  potentiation  5 mg AM   08/14/21 appt noted: Doing OK.  Hectic and busier at work. Mood is OK.  A lot more leveled out with less highs and lows than before. Disc Abilify  and switched to night bc thought he was a little tired from it. Plan: For depression duloxetine   to 120 mg daily was helpful at the time Start replacing VPA with Abilify  to reduce number of meds, polypharmachy. Increase Abilify  to 7.5 and reduce Depakote  to 1000 mg  Next visit continue transituion if successful  11/14/2021 appt noted: Getting botox for tremor, essential. Not a whole lot of difference.  Not taking Abilify  for the last month or so.  Mood has been stable. Plan: For depression duloxetine  to 120 mg daily was helpful at the time Continue Depakote  to 1000 mg bc irritability is ok on this  dose. Will hold Abilify  for now since he's not depressed.   05/15/22 appt noted: No med changes. No concerns noted. Still on duloxtine 120 and Depakote  1000 and naltrexone  50. Mood stable and depression also.  Irritability managed and depression.  A little depression here and there.. Energy is better with more activity and exercising 4-5 times per week. No SE Limited alcoholand doesn't think it's a problem. Plan: For depression duloxetine  to 120 mg daily was helpful at the time Continue Depakote  to 1000 mg bc irritability is ok on this dose. Will hold Abilify  for now since he's not depressed.  Continue naltrexone   11/19/22 appt noted: Shortage of naltrexone . Still on Depakote  ER 1000 mg daily, duloxetine  120 mg daily. Pretty good with meds.  Holidays fun and hectic.  Busy time of year.  No real complaints.  Anxiety manageable. Levels things out.  Satisfied with dose.   Saifsified. Only issue occ is sleep.  Sometimes not with anxiety.  12-23-2023 appt noted: Wife died 06/24/2024in her sleep out of the blue. Rough year.   Tries to take things day by day.  D moved in with himfor now which has been helpful. 03/24/23 called and asked about taking Depakote  ER 1500 mg HS.  But went back to 1000 mg HS. Continues duloxetine  120 daily, naltrexone  50 mg daily propranolol Awakens 2-3 times per night.   No sleepers.  Total sleep good night 5 hours.   Took 6 mos dealing with estate after she died.  But it is done.  Miix of grief and some depression.  OK with alcohol.  Increased some.  Some time on hands he didn't have.  Goes to bar regularly after work for social interaction.  Not incresed dramatically.   Wife ws very organized and had a book which told him what to do if something happened to her.   02/24/24 appt noted:  with older B Med: no change, duloxetine  120, Depakote  ER 1500, naltrexone  50 Wt gain can be related to Depakote  Wife's BD soon and death anniv 2023-04-16.   Seems like peaks are the same  but valleys all lower.  Wearing on me and my kids.  Kids caring for him like his wife did which is good and bad.  He has twins, boy and girl.   Only way he can be around people his age is to drink wit friends.  If not home in certain time frame will get texts and phone calls. D will call son to call pt if there are concerns. Older B concerns.  2 kids don't drink and are against drinking.  Kids think his drinking on him casually is wrong.  Prefers to leave work and go out with friends and B thinks what Marcey is doing is not wrong.    Marcey says on usual day not excessive drinking but some days too much.  He doesn't believe he's drinking excessively at bars.   D moved in to take  care of him.   A little bit of anger irritability in dealing with son.  No such px at work.  05/04/24 appt noted: Med: no change, duloxetine  120, Depakote  ER 1500, naltrexone  50, Abilify  5 , trazodone  none Main issue EFA. Abilify  helped.  More calming.  Less down.  It feels right for me.   Bumpy period with anniversary of wife's death and mother's day. More upbeat days than down days.   Family not as intrusive as they were.   D still living with him. No SE.  08/30/24 appt noted:  Med:  duloxetine  120, Depakote  ER 1500, naltrexone  50, Abilify  5 , trazodone  none CC energy and motivation low. Not sure it is depression.   Has seen cardiology and is ok.  Saw Dr. Clarice about fatigue without abnl. Labs good.   Avg 6 hour is a little better.  Even on good night of sleep still prx with energy and motivation.   Less alcohol and definitely not better.  Avg 3-4 beers max and nothing at home. Sx fatigue worse for a year.   Not as much anxiety.  End of year stresses him out about work load.  Hopes to work 4-5 more years.  Some concerns at work affected.  Pulled in too many directions at work.  Boss asked if he was burned out. Not severely dep but some.   Not irritablel.   Past Psychiatric Medication Trials:  Duloxetine  120,  Lexapro, Viibryd,  Depakote  1500 Abilify  7.5 mg  Fraternal twins boy and girl.  Review of Systems:  Review of Systems  Constitutional:  Positive for fatigue. Negative for unexpected weight change.  Cardiovascular:  Negative for chest pain and palpitations.  Skin:  Positive for rash.  Neurological:  Positive for tremors. Negative for weakness.  Psychiatric/Behavioral:  Positive for dysphoric mood and sleep disturbance.     Medications: I have reviewed the patient's current medications.  Current Outpatient Medications  Medication Sig Dispense Refill   aspirin 81 MG chewable tablet Chew 81 mg by mouth daily.     Azilsartan-Chlorthalidone  (EDARBYCLOR) 40-25 MG TABS Take by mouth daily.     buPROPion (WELLBUTRIN XL) 150 MG 24 hr tablet 1 tablet in the morning for 1 week, the 2 tablets each am 30 tablet 0   propranolol (INDERAL) 20 MG tablet Take by mouth.     rosuvastatin (CRESTOR) 20 MG tablet Take 20 mg by mouth daily.     tamsulosin (FLOMAX) 0.4 MG CAPS capsule Take 0.4 mg by mouth at bedtime.     torsemide  (DEMADEX ) 20 MG tablet TAKE TWO TABLETS BY MOUTH every afternoon 15 tablet 0   divalproex  (DEPAKOTE  ER) 500 MG 24 hr tablet Take 3 tablets (1,500 mg total) by mouth daily. 90 tablet 2   DULoxetine  (CYMBALTA ) 60 MG capsule Take 2 capsules (120 mg total) by mouth daily. 180 capsule 3   naltrexone  (DEPADE) 50 MG tablet Take 1 tablet (50 mg total) by mouth at bedtime. 90 tablet 3   traZODone  (DESYREL ) 50 MG tablet 1-2 tablets at night for sleep (Patient not taking: Reported on 08/30/2024) 60 tablet 2   No current facility-administered medications for this visit.    Medication Side Effects: None  Allergies: No Known Allergies  Past Medical History:  Diagnosis Date   Abnormal involuntary movement 03/07/2021   Androgen deficiency 03/07/2021   Anemia 03/07/2021   Anxiety    Atherosclerosis of coronary artery 03/07/2021   Atherosclerotic heart disease of native coronary artery  without  angina pectoris    Benign prostatic hyperplasia with lower urinary tract symptoms 03/07/2021   Bipolar 1 disorder, depressed (HCC) 10/13/2019   Body mass index (BMI) 33.0-33.9, adult 03/07/2021   Coronary atherosclerosis due to calcified coronary lesion    COVID-19 virus infection 10/14/2019   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   Dry skin 03/07/2021   Dyspnea on exertion 03/07/2021   Eczema 03/07/2021   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   Edema of lower extremity 03/07/2021   Encounter for therapeutic drug level monitoring 03/07/2021   Essential hypertension    Essential tremor 02/12/2018   EtOH dependence (HCC) 11/04/2018   Generalized anxiety disorder 03/07/2021   History of adenomatous polyp of colon 03/07/2021   History of infectious disease 03/07/2021   History of syncope 03/07/2021   Hypercalcemia 05/06/2021   Hypertension    Hypotension 10/14/2019   Lymphedema 05/06/2021   Nocturia 03/07/2021   Orthostatic hypotension 10/14/2019   Other specified abnormal findings of blood chemistry 05/06/2021   Prediabetes 03/07/2021   Pure hypercholesterolemia    Recurrent depression 03/07/2021   Tinea pedis 03/07/2021    Family History  Problem Relation Age of Onset   Tremor Mother    Diabetes Mother    Diabetes Father    Atrial fibrillation Father    Heart disease Father    Lymphoma Father    Tremor Maternal Grandmother    Heart disease Paternal Grandfather     Social History   Socioeconomic History   Marital status: Married    Spouse name: Not on file   Number of children: 2   Years of education: BA   Highest education level: Not on file  Occupational History   Occupation: [employer]  Tobacco Use   Smoking status: Never   Smokeless tobacco: Never  Vaping Use   Vaping status: Never Used  Substance and Sexual Activity   Alcohol use: Not Currently   Drug use: No   Sexual activity: Not Currently  Other Topics Concern   Not on file  Social History  Narrative   Lives with wife   Caffeine use: daily   Right handed       Works as a IT trainer   Social Drivers of Corporate investment banker Strain: Low Risk  (04/23/2024)   Received from [facility]   Overall Financial Resource Strain (CARDIA)    Difficulty of Paying Living Expenses: Not hard at all  Food Insecurity: No Food Insecurity (04/23/2024)   Received from [facility]   Hunger Vital Sign    Within the past 12 months, you worried that your food would run out before you got the money to buy more.: Never true    Within the past 12 months, the food you bought just didn't last and you didn't have money to get more.: Never true  Transportation Needs: No Transportation Needs (04/23/2024)   Received from [facility] - [facility]    Lack of Transportation (Medical): No    Lack of Transportation (Non-Medical): No  Physical Activity: Insufficiently Active (04/23/2024)   Received from [facility]   Exercise Vital Sign    On average, how many days per week do you engage in moderate to strenuous exercise (like a brisk walk)?: 3 days    On average, how many minutes do you engage in exercise at this level?: 30 min  Stress: No Stress Concern Present (04/23/2024)   Received from [facility] - Occupational Stress Questionnaire    Feeling of Stress : Not at all  Social Connections: Moderately Integrated (04/23/2024)   Received from [facility]   Social Network    How would you rate your social network (family, work, friends)?: Adequate participation with social networks  Intimate Partner Violence: Not At Risk (04/23/2024)   Received from [facility]   HITS    Over the last 12 months how often did your partner physically hurt you?: Never    Over the last 12 months how often did your partner insult you or talk down to you?: Never    Over the last 12 months how often did your partner threaten you with physical harm?: Never    Over  the last 12 months how often did your partner scream or curse at you?: Never    Past Medical History, Surgical history, Social history, and Family history were reviewed and updated as appropriate.   Please see review of systems for further details on the patient's review from today.   Objective:   Physical Exam:  There were no vitals taken for this visit.  Physical Exam Constitutional:      General: He is not in acute distress.    Appearance: He is well-developed.  Musculoskeletal:        General: No deformity.  Neurological:     Mental Status: He is alert and oriented to person, place, and time.     Coordination: Coordination normal.  Psychiatric:        Attention and Perception: He is attentive. He does not perceive auditory hallucinations.        Mood and Affect: Mood is depressed. Mood is not anxious. Affect is not labile or blunt.        Speech: Speech normal. Speech is not rapid and pressured.        Behavior: Behavior normal.        Thought Content: Thought content normal. Thought content is not delusional. Thought content does not include homicidal or suicidal ideation. Thought content does not include suicidal plan.        Cognition and Memory: Cognition normal.        Judgment: Judgment normal.     Comments: Insight intact. No auditory or visual hallucinations. No delusions.        Lab Review:     Component Value Date/Time   NA 140 12/14/2019 0815   K 3.7 12/14/2019 0815   CL 100 12/14/2019 0815   CO2 30 12/14/2019 0815   GLUCOSE 99 12/14/2019 0815   BUN 18 12/14/2019 0815   CREATININE 1.00 12/14/2019 0815   CALCIUM  9.2 12/14/2019 0815   PROT 7.0 12/14/2019 0815   ALBUMIN 4.1 12/14/2019 0815   AST 16 12/14/2019 0815   ALT 20 12/14/2019 0815   ALKPHOS 45 12/14/2019 0815   BILITOT 0.4 12/14/2019 0815   GFRNONAA >60 12/14/2019 0815   GFRAA >60 12/14/2019 0815       Component Value Date/Time   WBC 8.3 03/03/2021 1925   RBC 4.45 03/03/2021 1925   HGB  13.8 03/03/2021 1925   HGB 14.0 12/14/2019 0815   HCT 39.4 03/03/2021 1925   PLT 209 03/03/2021 1925   PLT 242 12/14/2019 0815   MCV 88.5 03/03/2021 1925   MCH 31.0 03/03/2021 1925   MCHC 35.0 03/03/2021 1925   RDW 11.8 03/03/2021 1925   LYMPHSABS 3.2 03/03/2021 1925   MONOABS 0.7 03/03/2021 1925   EOSABS 0.2 03/03/2021 1925  BASOSABS 0.1 03/03/2021 1925    No results found for: POCLITH, LITHIUM   No results found for: PHENYTOIN, PHENOBARB, VALPROATE, CBMZ   .res Assessment: Plan:    Gonzalo was seen today for follow-up, depression, fatigue and sleeping problem.  Diagnoses and all orders for this visit:  Encounter for therapeutic drug monitoring -     Ammonia -     Valproic acid level  Depression, major, recurrent, in partial remission -     DULoxetine  (CYMBALTA ) 60 MG capsule; Take 2 capsules (120 mg total) by mouth daily. -     divalproex  (DEPAKOTE  ER) 500 MG 24 hr tablet; Take 3 tablets (1,500 mg total) by mouth daily. -     buPROPion (WELLBUTRIN XL) 150 MG 24 hr tablet; 1 tablet in the morning for 1 week, the 2 tablets each am -     Ammonia -     Valproic acid level  Generalized anxiety disorder  Grief  Insomnia due to mental condition  Alcohol dependence in remission (HCC) -     naltrexone  (DEPADE) 50 MG tablet; Take 1 tablet (50 mg total) by mouth at bedtime.  High risk medication use -     Ammonia -     Valproic acid level     Marcey  had a good response to a combination of duloxetine  plus Depakote .  Depakote  was added for irritability which was not adequately addressed by the duloxetine .  His depression is better with Abilify  but still dealing with grief. His irritability is under control.    For depression duloxetine  to 120 mg daily was helpful at the time He's thinking he might be taking 1500 Depakote . Check level and then drop to 1000 mg daily  Naltrexone  50 daily.  DT low motivation switch to Wellbutrin from Abilify .  300.  Also  checkammonia level to RO effects from VPA  Needs more sleep.  Rec trial Trazodone  not habit forming. 50-100 mg HS. Disc SE and call if a problem.  He has not tried it.  Marcey is also been able to greatly reduce his alcohol intake and appears to no longer be abusing alcohol.   But is drinking after work with friends as limited social interaction.    Counseling 20: Wife died unexpectedly in 2024-06-12in her sleep.   Disc grieving.  [name] was a Clinical biochemist I the home.    She got him into therapy and psych tx.   Encourage continued social interaction to help depression and grief.  Follow-up 2 mos  Lorene Macintosh MD, DFAPA  Please see After Visit Summary for patient specific instructions.  Future Appointments  Date Time Provider Department Center  10/26/2024 10:30 AM Cottle, Lorene KANDICE Raddle., MD [facility] None    Orders Placed This Encounter  Procedures   Ammonia   Valproic acid level     -------------------------------

## 2024-09-03 LAB — AMMONIA: Ammonia: 65 ug/dL (ref 40–200)

## 2024-09-03 LAB — VALPROIC ACID LEVEL: Valproic Acid Lvl: 75 ug/mL (ref 50–100)

## 2024-09-07 ENCOUNTER — Ambulatory Visit: Payer: Self-pay | Admitting: Psychiatry

## 2024-09-07 NOTE — Progress Notes (Signed)
 At last appt he indicated he thought he was taking 3 of the 500 mg Depakote  nightly but wasn't sure.  Please call and verify whether he was taking 1500 or 1000 mg HS.    The plan was to evaluate whether Depakote  was causing fatigue or not.  His ammonia level was normal so that is not the problem.  If he's taking 1500 mg Depakote  now he can drop the dose to 1000 mg nightly. But his level of Depakote  was only75 so dropping to 1000 mg may put him below the therapeutic range of Depakote .  So take the 1000 mg daily only for 2 weeks and note whether is fatigue is better or not.  Then call us  with the results.  If he becomes more angry, depressed then increase it back to 1500 mg and call us .  Thanks. Lorene Macintosh, MD, DFAPA

## 2024-09-08 ENCOUNTER — Telehealth: Payer: Self-pay | Admitting: Emergency Medicine

## 2024-09-08 NOTE — Telephone Encounter (Signed)
 Date/Time: 10/30 @ 9:23 am  Reason for Contact: Medication Follow up  Pt was initially taking 1500 mg of Depakote , but reported fatigue. He has decreased dosage to 1000 mg daily on 10/27. Since then, there has been no changes of fatigue, but will continue monitoring SEs. Denies irritability, aggression or depressive symptoms.   Plan: Continue Depakote  1000 mg x 2 weeks. Pt will report to clinic with any changes noted.  Ammonia levels - normal. No concerns of ammonia toxicity. Depakote  level - 75.  Decreasing dosage to 1000 mg may put him below the therapeutic range of Depakote  (50-125). Will monitor.  Will increase to 1500 mg, if pt becomes more angry, irritable or depressed.  Patient instructed to call or present to clinic if adverse effects or mood worsening occur.  Patient verbalized understanding and agreement.   Follow-up: 12/17 with Dr. Lorene Macintosh

## 2024-09-15 ENCOUNTER — Telehealth: Payer: Self-pay

## 2024-09-15 NOTE — Telephone Encounter (Signed)
 Sent to Dr. Jennelle Human via [facility]

## 2024-09-15 NOTE — Telephone Encounter (Signed)
 From MyChart:  I have been taking the decrease dose of Depakote  however I have not noticed a decrease in fatigue. Since starting the Wellbutrin and decreasing the Depakote  I have had a significant increase in my essential tremors. My hands are shaking significantly bilaterally as well as head. Is that a side effect of Wellbutrin?   There are several msgs back and forth in MyChart.   Note from Priya: Date/Time: 10/30 @ 9:23 am   Reason for Contact: Medication Follow up   Pt was initially taking 1500 mg of Depakote , but reported fatigue. He has decreased dosage to 1000 mg daily on 10/27. Since then, there has been no changes of fatigue, but will continue monitoring SEs. Denies irritability, aggression or depressive symptoms.   Plan: Continue Depakote  1000 mg x 2 weeks. Pt will report to clinic with any changes noted.   Ammonia levels - normal. No concerns of ammonia toxicity/hyperammonemia. Depakote  level - 75.  Decreasing dosage to 1000 mg may put him below the therapeutic range of Depakote  (50-125). Will monitor.   Will increase to 1500 mg, if pt becomes more angry, irritable or depressed.   Patient instructed to call or present to clinic if adverse effects or mood worsening occur.   Patient verbalized understanding and agreement.    Follow-up: 12/17 with Dr. Lorene Macintosh

## 2024-09-16 NOTE — Telephone Encounter (Signed)
 LVM to [facility]

## 2024-09-16 NOTE — Telephone Encounter (Signed)
 Yes, Wellbutrin can cause tremor.  Stop it and tremor will be dramatically better in 2 days.  To address the lack of motivation we can replace both Depakote  and Abilify  with Vraylar. So stop Abilify . Start Vraylar 1.5 mg daily (suggest he pick up samples) After 2 weeks on Depakote  1000 mg daily, then reduce to 500 mg daily for 2 weeks, then stop it.  Lorene Macintosh, MD, DFAPA'

## 2024-09-16 NOTE — Telephone Encounter (Signed)
 Sent recommendations via MyChart. Pulled samples for him and put a copay card in bag.

## 2024-10-26 ENCOUNTER — Ambulatory Visit: Admitting: Psychiatry

## 2024-10-26 ENCOUNTER — Encounter: Payer: Self-pay | Admitting: Psychiatry

## 2024-10-26 DIAGNOSIS — F411 Generalized anxiety disorder: Secondary | ICD-10-CM | POA: Diagnosis not present

## 2024-10-26 DIAGNOSIS — F900 Attention-deficit hyperactivity disorder, predominantly inattentive type: Secondary | ICD-10-CM

## 2024-10-26 DIAGNOSIS — G250 Essential tremor: Secondary | ICD-10-CM

## 2024-10-26 DIAGNOSIS — F5105 Insomnia due to other mental disorder: Secondary | ICD-10-CM | POA: Diagnosis not present

## 2024-10-26 DIAGNOSIS — F3341 Major depressive disorder, recurrent, in partial remission: Secondary | ICD-10-CM

## 2024-10-26 MED ORDER — LISDEXAMFETAMINE DIMESYLATE 30 MG PO CAPS: 30.0000 mg | ORAL_CAPSULE | Freq: Every day | ORAL | 0 refills | Status: AC

## 2024-10-26 NOTE — Progress Notes (Signed)
 Chad Reynolds [id number] 06/13/1961 63 y.o.  Subjective:   Patient ID:  Chad Reynolds is a 63 y.o. (DOB June 18, 1961) male.  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  visit was December 2020.  he was doing well.  No meds were changed.  He was taking duloxetine  90 mg, Depakote  ER 1500 mg, naltrexone , and stopped Campral .  04/26/20 appt with following noted:  Seen with wife. Both hospitalized with Covid in December.   Fully recovered.  Vaccinated. Work up for fatigue testosterone started.  Not in normal range yet. Some ups and downs lately good and bad without reason. Wife noticing more depression with negative thoughts and dwelling on death.  But mood swings are under control.  No SE he notices.  Wife sees more scratching of himself but he says it's a chronic problem. Really busy working 2 jobs.    Patient reports stable mood and denies irritable moods. He agrees he's more negative and down.   Patient denies any recent difficulty with anxiety.  Patient has occ difficulty with sleep initiation or maintenance. Denies appetite disturbance.  Patient reports that energy and motivation have been good.  Patient denies any difficulty with concentration.  Patient denies any suicidal ideation.  Sometimes crazy dreams. Plan: For depression increase duloxetine  to 120 mg daily.  06/28/20 appt with the following noted: Not taking naltrexone  he thinks. Is taking duloxetine  120 mg daily and Depakote  1500 mg daily. Working 2 jobs. Helped with increase Duloxetine .  Less tense, more level.  Less down and less negative thinking as often.  Can find some things he enjoys.  Walks with [name].  Working on losing weight.  Today's F's 62 yo today. Some sleep issues DT nocturia.  02/07/21 appt noted: Jan March higher stress  with business and personal.  Wife's family member died suicide.  Another family member died cancer.  F health issues and hosp lately in rehab.  M bad tremors.   Affected him with more down and anxiety, esp more down.  Wonders about increasing Cymbalta .  Handles stress better than the past and not abusing alcohol like in the past.  On duloxetine  120 and Depakote  1500.   Plan: For depression increase duloxetine  to 120 mg daily was helpful at the time Continue Depakote  1500 mg daily for irritability Abilify  potentiation 2.5 mg AM for 1 week and if no better then 5 mg AM   04-14-21 appt noted: F died 3 weeks ago. Abilify  helped depression and wife noticed.  More mellow and less stressed. Increased to 5 mg without SE. Grief.  Anxiety now more than depression but manageable.  Not overly anxious.  Depakote  and Abilify  seemed to help the most. Doing well with alcohol. Occ beer without excess now.  Minimal alcohol.  No longer hangs out with friends who drink to excess. Plan: For depression duloxetine  to 120 mg daily was helpful at the time Continue Depakote  1500 mg daily for irritability Benefit so continue Abilify  potentiation  5 mg AM   08/14/21 appt noted: Doing OK.  Hectic and busier at work. Mood is OK.  A lot more leveled out with less highs and lows than before. Disc Abilify  and switched to night bc thought he was a little tired from it. Plan: For depression duloxetine  to 120 mg daily  was helpful at the time Start replacing VPA with Abilify  to reduce number of meds, polypharmachy. Increase Abilify  to 7.5 and reduce Depakote  to 1000 mg  Next visit continue transituion if successful  11/14/2021 appt noted: Getting botox for tremor, essential. Not a whole lot of difference.  Not taking Abilify  for the last month or so.  Mood has been stable. Plan: For depression duloxetine  to 120 mg daily was helpful at the time Continue Depakote  to 1000 mg bc irritability is ok on this dose. Will hold Abilify   for now since he's not depressed.   05/15/22 appt noted: No med changes. No concerns noted. Still on duloxtine 120 and Depakote  1000 and naltrexone  50. Mood stable and depression also.  Irritability managed and depression.  A little depression here and there.. Energy is better with more activity and exercising 4-5 times per week. No SE Limited alcoholand doesn't think it's a problem. Plan: For depression duloxetine  to 120 mg daily was helpful at the time Continue Depakote  to 1000 mg bc irritability is ok on this dose. Will hold Abilify  for now since he's not depressed.  Continue naltrexone   11/19/22 appt noted: Shortage of naltrexone . Still on Depakote  ER 1000 mg daily, duloxetine  120 mg daily. Pretty good with meds.  Holidays fun and hectic.  Busy time of year.  No real complaints.  Anxiety manageable. Levels things out.  Satisfied with dose.   Saifsified. Only issue occ is sleep.  Sometimes not with anxiety.  12/12/2023 appt noted: Wife died 06/07/24in her sleep out of the blue. Rough year.   Tries to take things day by day.  D moved in with himfor now which has been helpful. 03/24/23 called and asked about taking Depakote  ER 1500 mg HS.  But went back to 1000 mg HS. Continues duloxetine  120 daily, naltrexone  50 mg daily propranolol Awakens 2-3 times per night.   No sleepers.  Total sleep good night 5 hours.   Took 6 mos dealing with estate after she died.  But it is done.  Miix of grief and some depression.  OK with alcohol.  Increased some.  Some time on hands he didn't have.  Goes to bar regularly after work for social interaction.  Not incresed dramatically.   Wife ws very organized and had a book which told him what to do if something happened to her.   02/24/24 appt noted:  with older B Med: no change, duloxetine  120, Depakote  ER 1500, naltrexone  50 Wt gain can be related to Depakote  Wife's BD soon and death anniv Apr 05, 2023.   Seems like peaks are the same but valleys all lower.   Wearing on me and my kids.  Kids caring for him like his wife did which is good and bad.  He has twins, boy and girl.   Only way he can be around people his age is to drink wit friends.  If not home in certain time frame will get texts and phone calls. D will call son to call pt if there are concerns. Older B concerns.  2 kids don't drink and are against drinking.  Kids think his drinking on him casually is wrong.  Prefers to leave work and go out with friends and B thinks what Marcey is doing is not wrong.    Marcey says on usual day not excessive drinking but some days too much.  He doesn't believe he's drinking excessively at bars.   D moved in to take care of him.  A little bit of anger irritability in dealing with son.  No such px at work.  05/04/24 appt noted: Med: no change, duloxetine  120, Depakote  ER 1500, naltrexone  50, Abilify  5 , trazodone  none Main issue EFA. Abilify  helped.  More calming.  Less down.  It feels right for me.   Bumpy period with anniversary of wife's death and mother's day. More upbeat days than down days.   Family not as intrusive as they were.   D still living with him. No SE.  08/30/24 appt noted:  Med:  duloxetine  120, Depakote  ER 1500, naltrexone  50, Abilify  5 , trazodone  none CC energy and motivation low. Not sure it is depression.   Has seen cardiology and is ok.  Saw Dr. Clarice about fatigue without abnl. Labs good.   Avg 6 hour is a little better.  Even on good night of sleep still prx with energy and motivation.   Less alcohol and definitely not better.  Avg 3-4 beers max and nothing at home. Sx fatigue worse for a year.   Not as much anxiety.  End of year stresses him out about work load.  Hopes to work 4-5 more years.  Some concerns at work affected.  Pulled in too many directions at work.  Boss asked if he was burned out. Not severely dep but some.   Not irritablel. Plan: For depression duloxetine  to 120 mg daily was helpful at the time He's  thinking he might be taking 1500 Depakote . Check level and then drop to 1000 mg daily Naltrexone  50 daily. DT low motivation switch to Wellbutrin  from Abilify .  300. Also checkammonia level to RO effects from VPA  10/29/33f TC: At last appt he indicated he thought he was taking 3 of the 500 mg Depakote  nightly but wasn't sure.  Please call and verify whether he was taking 1500 or 1000 mg HS.    The plan was to evaluate whether Depakote  was causing fatigue or not.  His ammonia level was normal so that is not the problem.  If he's taking 1500 mg Depakote  now he can drop the dose to 1000 mg nightly. But his level of Depakote  was only75 so dropping to 1000 mg may put him below the therapeutic range of Depakote .  So take the 1000 mg daily only for 2 weeks and note whether is fatigue is better or not.  Then call us  with the results.  If he becomes more angry, depressed then increase it back to 1500 mg and call us .  Thanks. Lorene Macintosh, MD, DFAPA 09/08/24 TC:  Date/Time: 10/30 @ 9:23 am   Reason for Contact: Medication Follow up   Pt was initially taking 1500 mg of Depakote , but reported fatigue. He has decreased dosage to 1000 mg daily on 10/27. Since then, there has been no changes of fatigue, but will continue monitoring SEs. Denies irritability, aggression or depressive symptoms.   Plan: Continue Depakote  1000 mg x 2 weeks. Pt will report to clinic with any changes noted.   Ammonia levels - normal. No concerns of ammonia toxicity. Depakote  level - 75.  Decreasing dosage to 1000 mg may put him below the therapeutic range of Depakote  (50-125). Will monitor.   Will increase to 1500 mg, if pt becomes more angry, irritable or depressed.   Patient instructed to call or present to clinic if adverse effects or mood worsening occur.   Patient verbalized understanding and agreement.    Follow-up: 12/17 with Dr. Lorene Macintosh  09/08/24  9:25 AM Florina Friends, PA-C attempted to contact  Chrystie Elspeth HERO (Call Answered)  09/16/24 TC:   From MyChart:   I have been taking the decrease dose of Depakote  however I have not noticed a decrease in fatigue. Since starting the Wellbutrin  and decreasing the Depakote  I have had a significant increase in my essential tremors. My hands are shaking significantly bilaterally as well as head. Is that a side effect of Wellbutrin ?     MD resp:  Yes, Wellbutrin  can cause tremor.  Stop it and tremor will be dramatically better in 2 days.  To address the lack of motivation we can replace both Depakote  and Abilify  with Vraylar. So stop Abilify . Start Vraylar 1.5 mg daily (suggest he pick up samples) After 2 weeks on Depakote  1000 mg daily, then reduce to 500 mg daily for 2 weeks, then stop it.  Lorene Macintosh, MD, DFAPA'     10/26/24 appt noted:  Med: no VPA, no Wellbutrin , duloxetine  120, Vraylar 1.5 mg daily. Fatigue un hanged off VPA. No Wellbutrin  DT tremor.  Genetic from mother but worse on Wellbutrin .   No SE now. Mood is pretty good and no problems off the Depakote .   Sees Dr. Clarice in 3 mos.   Saw card with no concerns raised.   Fatigue affects him at work.   No desire to go to gym. Started Tzhncb.  For 3-4 mos.  Fatigue before it. Lost 10-12 #.  Will increase to 1.7 mg soon.  No SE with it.  Thinks fatigue is worse than dep but maybe some with this time of year after loss of wife.   Sleep is ok but not great.   Infrequent feels of anxiety and will get no sleep but only once ever  4 mos.  Can feel it coming on but nothing he can do for it.   Average 3 beers daily.  Sometimes more.  All after work.  No drugs.   Didn't notice much with Vraylar.   Don't feel overly stressed generally.  Likes the duloxetine .   BP managed. Fatigue for years but can't remember when it started.   Did home sleep study with Pharr without OSA.   Colonoscopy in Nov was normal. No coffee 2-3 diet drinks daily.  Past Psychiatric Medication Trials:  Duloxetine   120, Lexapro, Viibryd,  Wellbutrin  tremor worse Depakote  1500 Abilify  7.5 mg Vraylar 1.5 mg    Fraternal twins boy and girl.  Review of Systems:  Review of Systems  Constitutional:  Positive for fatigue. Negative for unexpected weight change.  Cardiovascular:  Negative for chest pain and palpitations.  Skin:  Positive for rash.  Neurological:  Positive for tremors. Negative for weakness.  Psychiatric/Behavioral:  Positive for dysphoric mood and sleep disturbance.     Medications: I have reviewed the patient's current medications.  Current Outpatient Medications  Medication Sig Dispense Refill   cariprazine (VRAYLAR) 1.5 MG capsule 1.5 mg daily.     DULoxetine  (CYMBALTA ) 60 MG capsule Take 2 capsules (120 mg total) by mouth daily. 180 capsule 3   lisdexamfetamine  (VYVANSE ) 30 MG capsule Take 1 capsule (30 mg total) by mouth daily. 30 capsule 0   naltrexone  (DEPADE) 50 MG tablet Take 1 tablet (50 mg total) by mouth at bedtime. 90 tablet 3   tamsulosin (FLOMAX) 0.4 MG CAPS capsule Take 0.4 mg by mouth at bedtime.     torsemide  (DEMADEX ) 20 MG tablet TAKE TWO TABLETS BY MOUTH every afternoon 15 tablet 0  aspirin 81 MG chewable tablet Chew 81 mg by mouth daily.     Azilsartan-Chlorthalidone  (EDARBYCLOR) 40-25 MG TABS Take by mouth daily.     propranolol (INDERAL) 20 MG tablet Take by mouth.     rosuvastatin (CRESTOR) 20 MG tablet Take 20 mg by mouth daily.     traZODone  (DESYREL ) 50 MG tablet 1-2 tablets at night for sleep (Patient not taking: Reported on 10/26/2024) 60 tablet 2   No current facility-administered medications for this visit.    Medication Side Effects: None  Allergies: No Known Allergies  Past Medical History:  Diagnosis Date   Abnormal involuntary movement 03/07/2021   Androgen deficiency 03/07/2021   Anemia 03/07/2021   Anxiety    Atherosclerosis of coronary artery 03/07/2021   Atherosclerotic heart disease of native coronary artery without angina pectoris     Benign prostatic hyperplasia with lower urinary tract symptoms 03/07/2021   Bipolar 1 disorder, depressed (HCC) 10/13/2019   Body mass index (BMI) 33.0-33.9, adult 03/07/2021   Coronary atherosclerosis due to calcified coronary lesion    COVID-19 virus infection 10/14/2019   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   Dry skin 03/07/2021   Dyspnea on exertion 03/07/2021   Eczema 03/07/2021   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   Edema of lower extremity 03/07/2021   Encounter for therapeutic drug level monitoring 03/07/2021   Essential hypertension    Essential tremor 02/12/2018   EtOH dependence (HCC) 11/04/2018   Generalized anxiety disorder 03/07/2021   History of adenomatous polyp of colon 03/07/2021   History of infectious disease 03/07/2021   History of syncope 03/07/2021   Hypercalcemia 05/06/2021   Hypertension    Hypotension 10/14/2019   Lymphedema 05/06/2021   Nocturia 03/07/2021   Orthostatic hypotension 10/14/2019   Other specified abnormal findings of blood chemistry 05/06/2021   Prediabetes 03/07/2021   Pure hypercholesterolemia    Recurrent depression 03/07/2021   Tinea pedis 03/07/2021    Family History  Problem Relation Age of Onset   Tremor Mother    Diabetes Mother    Diabetes Father    Atrial fibrillation Father    Heart disease Father    Lymphoma Father    Tremor Maternal Grandmother    Heart disease Paternal Grandfather     Social History   Socioeconomic History   Marital status: Married    Spouse name: Not on file   Number of children: 2   Years of education: BA   Highest education level: Not on file  Occupational History   Occupation: [employer]  Tobacco Use   Smoking status: Never   Smokeless tobacco: Never  Vaping Use   Vaping status: Never Used  Substance and Sexual Activity   Alcohol use: Not Currently   Drug use: No   Sexual activity: Not Currently  Other Topics Concern   Not on file  Social History Narrative   Lives with wife    Caffeine use: daily   Right handed       Works as a IT TRAINER   Social Drivers of Health   Tobacco Use: Low Risk (10/26/2024)   Patient History    Smoking Tobacco Use: Never    Smokeless Tobacco Use: Never    Passive Exposure: Not on file  Financial Resource Strain: Low Risk (04/23/2024)   Received from [facility]   Overall Financial Resource Strain (CARDIA)    Difficulty of Paying Living Expenses: Not hard at all  Food Insecurity: No Food Insecurity (04/23/2024)  [facility] from [facility]    Within the past 12 months, you worried that your food would run out before you got the money to buy more.: Never true    Within the past 12 months, the food you bought just didn't last and you didn't have money to get more.: Never true  Transportation Needs: No Transportation Needs (04/23/2024)   Received from [facility]   PRAPARE - Transportation    Lack of Transportation (Medical): No    Lack of Transportation (Non-Medical): No  Physical Activity: Insufficiently Active (04/23/2024)   Received from [facility]   Exercise Vital Sign    On average, how many days per week do you engage in moderate to strenuous exercise (like a brisk walk)?: 3 days    On average, how many minutes do you engage in exercise at this level?: 30 min  Stress: No Stress Concern Present (04/23/2024)   Received from [facility] - Occupational Stress Questionnaire    Feeling of Stress : Not at all  Social Connections: Moderately Integrated (04/23/2024)   Received from [facility]   Social Network    How would you rate your social network (family, work, friends)?: Adequate participation with social networks  Intimate Partner Violence: Not At Risk (04/23/2024)   Received from [facility]   HITS    Over the last 12 months how often did your partner physically hurt you?: Never    Over the last 12 months how often did your partner insult you or talk down to you?:  Never    Over the last 12 months how often did your partner threaten you with physical harm?: Never    Over the last 12 months how often did your partner scream or curse at you?: Never  Depression (PHQ2-9): Not on file  Alcohol Screen: Not on file  Housing: Unknown (04/23/2024)   Received from [facility]    In the last 12 months, was there a time when you were not able to pay the mortgage or rent on time?: No    Number of Times Moved in the Last Year: Not on file    At any time in the past 12 months, were you homeless or living in a shelter (including now)?: No  Utilities: Not At Risk (04/23/2024)   Received from [facility]ities    Threatened with loss of utilities: No  Health Literacy: Not on file    Past Medical History, Surgical history, Social history, and Family history were reviewed and updated as appropriate.   Please see review of systems for further details on the patient's review from today.   Objective:   Physical Exam:  There were no vitals taken for this visit.  Physical Exam Constitutional:      General: He is not in acute distress.    Appearance: He is well-developed.  Musculoskeletal:        General: No deformity.  Neurological:     Mental Status: He is alert and oriented to person, place, and time.     Coordination: Coordination normal.  Psychiatric:        Attention and Perception: He is attentive. He does not perceive auditory hallucinations.        Mood and Affect: Mood is depressed. Mood is not anxious. Affect is not labile or blunt.        Speech: Speech normal. Speech is not rapid  and pressured or slurred.        Behavior: Behavior normal.        Thought Content: Thought content normal. Thought content is not delusional. Thought content does not include homicidal or suicidal ideation. Thought content does not include suicidal plan.        Cognition and Memory: Cognition normal.        Judgment: Judgment normal.     Comments:  Insight intact. No auditory or visual hallucinations. No delusions.        Lab Review:     Component Value Date/Time   NA 140 12/14/2019 0815   K 3.7 12/14/2019 0815   CL 100 12/14/2019 0815   CO2 30 12/14/2019 0815   GLUCOSE 99 12/14/2019 0815   BUN 18 12/14/2019 0815   CREATININE 1.00 12/14/2019 0815   CALCIUM  9.2 12/14/2019 0815   PROT 7.0 12/14/2019 0815   ALBUMIN 4.1 12/14/2019 0815   AST 16 12/14/2019 0815   ALT 20 12/14/2019 0815   ALKPHOS 45 12/14/2019 0815   BILITOT 0.4 12/14/2019 0815   GFRNONAA >60 12/14/2019 0815   GFRAA >60 12/14/2019 0815       Component Value Date/Time   WBC 8.3 03/03/2021 1925   RBC 4.45 03/03/2021 1925   HGB 13.8 03/03/2021 1925   HGB 14.0 12/14/2019 0815   HCT 39.4 03/03/2021 1925   PLT 209 03/03/2021 1925   PLT 242 12/14/2019 0815   MCV 88.5 03/03/2021 1925   MCH 31.0 03/03/2021 1925   MCHC 35.0 03/03/2021 1925   RDW 11.8 03/03/2021 1925   LYMPHSABS 3.2 03/03/2021 1925   MONOABS 0.7 03/03/2021 1925   EOSABS 0.2 03/03/2021 1925   BASOSABS 0.1 03/03/2021 1925    No results found for: POCLITH, LITHIUM   Lab Results  Component Value Date   VALPROATE 75 09/01/2024     .res Assessment: Plan:    Elgin was seen today for follow-up, depression and fatigue.  Diagnoses and all orders for this visit:  Depression, major, recurrent, in partial remission -     lisdexamfetamine  (VYVANSE ) 30 MG capsule; Take 1 capsule (30 mg total) by mouth daily.  Generalized anxiety disorder  Insomnia due to mental condition  Essential tremor  Attention deficit hyperactivity disorder (ADHD), predominantly inattentive type -     lisdexamfetamine  (VYVANSE ) 30 MG capsule; Take 1 capsule (30 mg total) by mouth daily.   Marcey  had a good response to a combination of duloxetine  plus Depakote .  Depakote  was added for irritability which was not adequately addressed by the duloxetine .  His depression is better with Abilify  but still dealing  with grief. His irritability is under control.    For depression duloxetine  to 120 mg daily was helpful at the time Continue Vraylar 1.5 mg for mood stability.  Marcey is also been able to greatly reduce his alcohol intake and appears to no longer be abusing alcohol.   But is drinking after work with friends as limited social interaction.    Asks for stimulant bc fatigue is affecting work.   For this and ADD will start trial Vyvanse  30 mg AM and will help dep potentially.  Follow-up 2 mos  Lorene Macintosh MD, DFAPA  Please see After Visit Summary for patient specific instructions.  No future appointments.   No orders of the defined types were placed in this encounter.    -------------------------------

## 2024-10-31 ENCOUNTER — Telehealth: Payer: Self-pay | Admitting: Psychiatry

## 2024-10-31 MED ORDER — CARIPRAZINE HCL 1.5 MG PO CAPS: 1.5000 mg | ORAL_CAPSULE | Freq: Every day | ORAL | 1 refills | Status: DC

## 2024-10-31 NOTE — Telephone Encounter (Signed)
 Rx sent. Called patient and told him it will likely require a PA and with the holiday I'm not sure how long it will take. He doesn't have any samples left. Pulled 1 box and he is aware.

## 2024-10-31 NOTE — Telephone Encounter (Signed)
 Marcey called asking for a script of vraylar   1.5 mg. He has been on samples and now would like a script sent to [facility]

## 2024-11-01 ENCOUNTER — Ambulatory Visit: Admitting: Psychiatry

## 2024-11-25 ENCOUNTER — Telehealth: Payer: Self-pay | Admitting: Psychiatry

## 2024-11-25 MED ORDER — CARIPRAZINE HCL 1.5 MG PO CAPS: 1.5000 mg | ORAL_CAPSULE | Freq: Every day | ORAL | 1 refills | Status: AC

## 2024-11-25 NOTE — Telephone Encounter (Signed)
 Patient lvm at 4:07 stating that a prescription for Vraylar  1.5mg  was sent to [facility] but needs it resent to [facility] [address]. Also needs a PA PH: [phone] Appt 2/18

## 2024-11-25 NOTE — Telephone Encounter (Signed)
PA needed for Vraylar 1.5 mg

## 2024-12-08 ENCOUNTER — Telehealth: Payer: Self-pay

## 2024-12-08 NOTE — Telephone Encounter (Signed)
 Vraylar  is a covered medication on pt's insurance with [employer]  [phone] pharmacy help desk

## 2024-12-09 ENCOUNTER — Telehealth: Payer: Self-pay | Admitting: Psychiatry

## 2024-12-09 NOTE — Telephone Encounter (Signed)
 [email or web address] with [employer] called regarding Chad Reynolds prior authorization. Reference number is [id number]. Vraylar  wont need a prior authorization. The formulary will be covered by the members policy on the formulary list.

## 2024-12-28 ENCOUNTER — Ambulatory Visit: Admitting: Psychiatry
